# Patient Record
Sex: Male | Born: 1970
Health system: Southern US, Community
[De-identification: ages and names within clinical notes are randomized; demographics above are authoritative.]

## PROBLEM LIST (undated history)

## (undated) DIAGNOSIS — E669 Obesity, unspecified: Secondary | ICD-10-CM

## (undated) DIAGNOSIS — I10 Essential (primary) hypertension: Secondary | ICD-10-CM

## (undated) HISTORY — DX: Obesity, unspecified: E66.9

## (undated) HISTORY — DX: Essential (primary) hypertension: I10

---

## 2006-07-25 ENCOUNTER — Emergency Department (HOSPITAL_COMMUNITY): Admission: EM | Admit: 2006-07-25 | Discharge: 2006-07-25 | Payer: Self-pay | Admitting: Emergency Medicine

## 2007-03-08 ENCOUNTER — Emergency Department (HOSPITAL_COMMUNITY): Admission: EM | Admit: 2007-03-08 | Discharge: 2007-03-08 | Payer: Self-pay | Admitting: Family Medicine

## 2007-03-30 ENCOUNTER — Emergency Department (HOSPITAL_COMMUNITY): Admission: EM | Admit: 2007-03-30 | Discharge: 2007-03-30 | Payer: Self-pay | Admitting: Family Medicine

## 2008-02-09 ENCOUNTER — Emergency Department (HOSPITAL_COMMUNITY): Admission: EM | Admit: 2008-02-09 | Discharge: 2008-02-09 | Payer: Self-pay | Admitting: Emergency Medicine

## 2008-11-07 ENCOUNTER — Emergency Department (HOSPITAL_COMMUNITY): Admission: EM | Admit: 2008-11-07 | Discharge: 2008-11-07 | Payer: Self-pay | Admitting: Emergency Medicine

## 2008-11-10 ENCOUNTER — Ambulatory Visit: Payer: Self-pay | Admitting: Family Medicine

## 2008-12-08 ENCOUNTER — Ambulatory Visit: Payer: Self-pay | Admitting: Family Medicine

## 2010-03-01 ENCOUNTER — Ambulatory Visit
Admission: RE | Admit: 2010-03-01 | Discharge: 2010-03-01 | Payer: Self-pay | Source: Home / Self Care | Attending: Family Medicine | Admitting: Family Medicine

## 2010-04-06 ENCOUNTER — Ambulatory Visit (INDEPENDENT_AMBULATORY_CARE_PROVIDER_SITE_OTHER): Payer: 59 | Admitting: Family Medicine

## 2010-04-06 DIAGNOSIS — I10 Essential (primary) hypertension: Secondary | ICD-10-CM

## 2010-04-06 DIAGNOSIS — F528 Other sexual dysfunction not due to a substance or known physiological condition: Secondary | ICD-10-CM

## 2010-04-06 DIAGNOSIS — Z79899 Other long term (current) drug therapy: Secondary | ICD-10-CM

## 2010-04-30 ENCOUNTER — Ambulatory Visit: Payer: 59 | Admitting: Family Medicine

## 2010-05-25 LAB — CBC
HCT: 41.4 % (ref 39.0–52.0)
MCHC: 34 g/dL (ref 30.0–36.0)
MCV: 88.1 fL (ref 78.0–100.0)
Platelets: 193 10*3/uL (ref 150–400)
RDW: 14.5 % (ref 11.5–15.5)
WBC: 5.1 10*3/uL (ref 4.0–10.5)

## 2010-05-25 LAB — DIFFERENTIAL
Basophils Absolute: 0 10*3/uL (ref 0.0–0.1)
Basophils Relative: 1 % (ref 0–1)
Eosinophils Absolute: 0.1 10*3/uL (ref 0.0–0.7)
Eosinophils Relative: 1 % (ref 0–5)
Neutrophils Relative %: 51 % (ref 43–77)

## 2010-05-25 LAB — BASIC METABOLIC PANEL
BUN: 9 mg/dL (ref 6–23)
Chloride: 106 mEq/L (ref 96–112)
Creatinine, Ser: 1.3 mg/dL (ref 0.4–1.5)
Glucose, Bld: 108 mg/dL — ABNORMAL HIGH (ref 70–99)
Potassium: 3.8 mEq/L (ref 3.5–5.1)

## 2010-10-28 ENCOUNTER — Emergency Department (HOSPITAL_COMMUNITY)
Admission: EM | Admit: 2010-10-28 | Discharge: 2010-10-28 | Disposition: A | Discharge status: Home / Self Care | Payer: 59 | Attending: Emergency Medicine | Admitting: Emergency Medicine

## 2010-10-28 ENCOUNTER — Inpatient Hospital Stay (INDEPENDENT_AMBULATORY_CARE_PROVIDER_SITE_OTHER)
Admission: RE | Admit: 2010-10-28 | Discharge: 2010-10-28 | Disposition: A | Discharge status: Home / Self Care | Payer: 59 | Source: Ambulatory Visit | Attending: Family Medicine | Admitting: Family Medicine

## 2010-10-28 DIAGNOSIS — K420 Umbilical hernia with obstruction, without gangrene: Secondary | ICD-10-CM

## 2010-11-07 ENCOUNTER — Encounter: Payer: Self-pay | Admitting: Family Medicine

## 2011-04-02 ENCOUNTER — Encounter: Payer: Self-pay | Admitting: Family Medicine

## 2011-04-02 ENCOUNTER — Ambulatory Visit (INDEPENDENT_AMBULATORY_CARE_PROVIDER_SITE_OTHER): Payer: 59 | Admitting: Family Medicine

## 2011-04-02 VITALS — BP 150/100 | HR 73 | Ht 70.0 in | Wt 334.0 lb

## 2011-04-02 DIAGNOSIS — Z79899 Other long term (current) drug therapy: Secondary | ICD-10-CM

## 2011-04-02 DIAGNOSIS — I10 Essential (primary) hypertension: Secondary | ICD-10-CM

## 2011-04-02 LAB — CBC WITH DIFFERENTIAL/PLATELET
Basophils Absolute: 0 10*3/uL (ref 0.0–0.1)
Eosinophils Relative: 1 % (ref 0–5)
Lymphocytes Relative: 40 % (ref 12–46)
Lymphs Abs: 2 10*3/uL (ref 0.7–4.0)
MCV: 86.4 fL (ref 78.0–100.0)
Neutro Abs: 2.5 10*3/uL (ref 1.7–7.7)
Neutrophils Relative %: 51 % (ref 43–77)
Platelets: 230 10*3/uL (ref 150–400)
RBC: 4.78 MIL/uL (ref 4.22–5.81)
RDW: 13.9 % (ref 11.5–15.5)
WBC: 5 10*3/uL (ref 4.0–10.5)

## 2011-04-02 MED ORDER — LISINOPRIL-HYDROCHLOROTHIAZIDE 20-12.5 MG PO TABS: 1.0000 | ORAL_TABLET | Freq: Every day | ORAL | Status: DC

## 2011-04-02 MED ORDER — AMLODIPINE BESYLATE 5 MG PO TABS: 5.0000 mg | ORAL_TABLET | Freq: Every day | ORAL | Status: DC

## 2011-04-02 NOTE — Progress Notes (Signed)
Subjective:    Patient ID: Parker Clay, male    DOB: [birth year], 41 y.o.   MRN: [id number]  HPI He is here for an interval evaluation. His last visit was in February of 2012. He would like his medications renewed. He recently had a health evaluation and his blood pressure was elevated. He has no concerns or complaints.   Review of Systems     Objective:   Physical Exam alert and in no distress. Tympanic membranes and canals are normal. Throat is clear. Tonsils are normal. Neck is supple without adenopathy or thyromegaly. Cardiac exam shows a regular sinus rhythm without murmurs or gallops. Lungs are clear to auscultation.        Assessment & Plan:   1. Hypertension  amLODipine (NORVASC) 5 MG tablet, lisinopril-hydrochlorothiazide (PRINZIDE,ZESTORETIC) 20-12.5 MG per tablet, CBC with Differential, Comprehensive metabolic panel, Lipid panel  2. Obesity, Class III, BMI 40-49.9 (morbid obesity)  CBC with Differential, Comprehensive metabolic panel, Lipid panel  3. Encounter for long-term (current) use of other medications  CBC with Differential, Comprehensive metabolic panel, Lipid panel   I will add Norvasc to his regimen. Also discussed in detail his weight in regard to diet and exercise. Strongly encouraged him to increase his physical activities as well as change his eating habits. Recheck here in one month.

## 2011-04-02 NOTE — Patient Instructions (Signed)
Take both of the blood pressure medications and I will see you in one month. Work on making lifestyle changes especially in regard to your exercise and diet.

## 2011-04-03 ENCOUNTER — Other Ambulatory Visit: Payer: Self-pay

## 2011-04-03 LAB — COMPREHENSIVE METABOLIC PANEL
ALT: 25 U/L (ref 0–53)
AST: 30 U/L (ref 0–37)
Calcium: 9.8 mg/dL (ref 8.4–10.5)
Chloride: 107 mEq/L (ref 96–112)
Creat: 1.32 mg/dL (ref 0.50–1.35)
Sodium: 140 mEq/L (ref 135–145)
Total Protein: 7 g/dL (ref 6.0–8.3)

## 2011-04-03 LAB — LIPID PANEL
Total CHOL/HDL Ratio: 4.6 Ratio
VLDL: 25 mg/dL (ref 0–40)

## 2011-04-03 NOTE — Progress Notes (Signed)
Quick Note:  The blood work is normal ______ 

## 2011-04-23 ENCOUNTER — Ambulatory Visit (INDEPENDENT_AMBULATORY_CARE_PROVIDER_SITE_OTHER): Payer: 59 | Admitting: Family Medicine

## 2011-04-23 ENCOUNTER — Encounter: Payer: Self-pay | Admitting: Family Medicine

## 2011-04-23 VITALS — BP 144/96 | HR 86 | Wt 334.0 lb

## 2011-04-23 DIAGNOSIS — I10 Essential (primary) hypertension: Secondary | ICD-10-CM

## 2011-04-23 DIAGNOSIS — E669 Obesity, unspecified: Secondary | ICD-10-CM

## 2011-04-23 MED ORDER — AMLODIPINE BESYLATE 10 MG PO TABS: 10.0000 mg | ORAL_TABLET | Freq: Every day | ORAL | Status: DC

## 2011-04-23 NOTE — Progress Notes (Signed)
Subjective:    Patient ID: Parker Clay, male    DOB: 1970-10-09, 41 y.o.   MRN: [id number]  HPI He is here for recheck. He is continued on lisinopril and amlodipine and having no difficulty. He has made some changes in his diet but not as far as exercise. His wife is pregnant and due April 2.  Review of Systems     Objective:   Physical Exam Alert and in no distress. Blood pressure is recorded       Assessment & Plan:   1. Hypertension   2. Obesity (BMI 30-39.9)    I will increase his Norvasc he noticed pressure is in the good range. Stay on these medications. Continue to work on [employer] and exercise. Recheck here in 6 months.

## 2011-04-23 NOTE — Patient Instructions (Signed)
Stay on these medications. Continue to work on [employer] and exercise.

## 2011-05-02 ENCOUNTER — Emergency Department (HOSPITAL_COMMUNITY)
Admission: EM | Admit: 2011-05-02 | Discharge: 2011-05-02 | Disposition: A | Discharge status: Home / Self Care | Payer: 59 | Attending: Emergency Medicine | Admitting: Emergency Medicine

## 2011-05-02 ENCOUNTER — Encounter (HOSPITAL_COMMUNITY): Payer: Self-pay | Admitting: *Deleted

## 2011-05-02 DIAGNOSIS — I10 Essential (primary) hypertension: Secondary | ICD-10-CM | POA: Insufficient documentation

## 2011-05-02 DIAGNOSIS — F17200 Nicotine dependence, unspecified, uncomplicated: Secondary | ICD-10-CM | POA: Insufficient documentation

## 2011-05-02 DIAGNOSIS — E669 Obesity, unspecified: Secondary | ICD-10-CM | POA: Insufficient documentation

## 2011-05-02 DIAGNOSIS — K429 Umbilical hernia without obstruction or gangrene: Secondary | ICD-10-CM

## 2011-05-02 MED ORDER — ONDANSETRON HCL 4 MG/2ML IJ SOLN
4.0000 mg | Freq: Once | INTRAMUSCULAR | Status: DC
  Filled 2011-05-02: qty 2

## 2011-05-02 MED ORDER — HYDROMORPHONE HCL PF 1 MG/ML IJ SOLN
1.0000 mg | Freq: Once | INTRAMUSCULAR | Status: DC
  Filled 2011-05-02: qty 1

## 2011-05-02 MED ORDER — SODIUM CHLORIDE 0.9 % IV SOLN: 1000.0000 mL | Freq: Once | INTRAVENOUS | Status: DC

## 2011-05-02 MED ORDER — SODIUM CHLORIDE 0.9 % IV SOLN: 1000.0000 mL | INTRAVENOUS | Status: DC

## 2011-05-02 NOTE — ED Notes (Signed)
Pt reports after working out this am, he sneezed and abd hernia began hurting. Sts has tried to reduce, unable to.

## 2011-05-02 NOTE — ED Provider Notes (Signed)
History     CSN: [id number]  Arrival date & time 05/02/11  8295   First MD Initiated Contact with Patient 05/02/11 [phone]      Chief Complaint  Patient presents with  . Hernia    HPI The patient has history of a periumbilical hernia. He was diagnosed approximately a year ago and was instructed to followup for a surgical procedure. Patient states it really had not been giving him any trouble so he did not have this done. This morning he was exercising on a exercise bike when he sneezed and had sudden onset of sharp severe pain in his periumbilical hernia. Patient felt the hernia was bulging out and he was not able to reduce it. Normally he is able to reduce the hernia easily. Patient denies any fevers, vomiting, or diarrhea. Palpation and movement increases pain and discomfort. It is a sharp pain that is 10 out of 10 when it is most severe. Past Medical History  Diagnosis Date  . Obesity   . Hypertension     History reviewed. No pertinent past surgical history.  No family history on file.  History  Substance Use Topics  . Smoking status: Current Some Day Smoker  . Smokeless tobacco: Never Used  . Alcohol Use: Yes     occasionally      Review of Systems  All other systems reviewed and are negative.    Allergies  Review of patient's allergies indicates no known allergies.  Home Medications   Current Outpatient Rx  Name Route Sig Dispense Refill  . AMLODIPINE BESYLATE 10 MG PO TABS Oral Take 1 tablet (10 mg total) by mouth daily. 30 tablet 11  . LISINOPRIL-HYDROCHLOROTHIAZIDE 20-12.5 MG PO TABS Oral Take 1 tablet by mouth daily. 30 tablet 1  . OLMESARTAN MEDOXOMIL-HCTZ 40-12.5 MG PO TABS Oral Take 1 tablet by mouth daily.        BP 145/79  Pulse 71  Temp(Src) 97.8 F (36.6 C) (Oral)  Resp 16  SpO2 100%  Physical Exam  Nursing note and vitals reviewed. Constitutional: He appears well-developed and well-nourished. No distress.  HENT:  Head: Normocephalic and  atraumatic.  Right Ear: External ear normal.  Left Ear: External ear normal.  Eyes: Conjunctivae are normal. Right eye exhibits no discharge. Left eye exhibits no discharge. No scleral icterus.  Neck: Neck supple. No tracheal deviation present.  Cardiovascular: Normal rate, regular rhythm and intact distal pulses.   Pulmonary/Chest: Effort normal and breath sounds normal. No stridor. No respiratory distress. He has no wheezes. He has no rales.  Abdominal: Soft. Bowel sounds are normal. He exhibits no distension. There is no tenderness. There is no rebound and no guarding. A hernia is present.         Periumbilical hernia, firm on the right side, unable to reduce, approximately 2 cm in size  Musculoskeletal: He exhibits no edema and no tenderness.  Neurological: He is alert. He has normal strength. No sensory deficit. Cranial nerve deficit:  no gross defecits noted. He exhibits normal muscle tone. He displays no seizure activity. Coordination normal.  Skin: Skin is warm and dry. No rash noted.  Psychiatric: He has a normal mood and affect.    ED Course  Procedures (including critical care time)   Labs Reviewed  CBC  DIFFERENTIAL  LIPASE, BLOOD  BASIC METABOLIC PANEL   No results found.    MDM  I was initially unable to reduce the patient's hernia because of his pain and discomfort. An  IV was ordered to administer pain medications. The nurse was attempting to do that and was unsuccessful on her first attempt. She went back to get additional equipment and when she went back into the room the patient told her that the hernia had reduced. I went back in and reexamined the patient. The hernia has completely reduced. The periumbilical region is soft without any tenderness. The hernia defect is palpable but there is no sign of incarceration or strangulation at this time.  I will discharge the patient with a referral to [facility] surgery        Celene Kras, MD 05/02/11 [phone]

## 2011-05-02 NOTE — Discharge Instructions (Signed)
Hernia  A hernia occurs when an internal organ pushes out through a weak spot in the abdominal wall. Hernias most commonly occur in the groin and around the navel. Hernias often can be pushed back into place (reduced). Most hernias tend to get worse over time. Some abdominal hernias can get stuck in the opening (irreducible or incarcerated hernia) and cannot be reduced. An irreducible abdominal hernia which is tightly squeezed into the opening is at risk for impaired blood supply (strangulated hernia). A strangulated hernia is a medical emergency. Because of the risk for an irreducible or strangulated hernia, surgery may be recommended to repair a hernia.  CAUSES    Heavy lifting.   Prolonged coughing.   Straining to have a bowel movement.   A cut (incision) made during an abdominal surgery.  HOME CARE INSTRUCTIONS    Bed rest is not required. You may continue your normal activities.   Avoid lifting more than 10 pounds (4.5 kg) or straining.   Cough gently. If you are a smoker it is best to stop. Even the best hernia repair can break down with the continual strain of coughing. Even if you do not have your hernia repaired, a cough will continue to aggravate the problem.   Do not wear anything tight over your hernia. Do not try to keep it in with an outside bandage or truss. These can damage abdominal contents if they are trapped within the hernia sac.   Eat a normal diet.   Avoid constipation. Straining over long periods of time will increase hernia size and encourage breakdown of repairs. If you cannot do this with diet alone, stool softeners may be used.  SEEK IMMEDIATE MEDICAL CARE IF:    You have a fever.   You develop increasing abdominal pain.   You feel nauseous or vomit.   Your hernia is stuck outside the abdomen, looks discolored, feels hard, or is tender.   You have any changes in your bowel habits or in the hernia that are unusual for you.   You have increased pain or swelling around the  hernia.   You cannot push the hernia back in place by applying gentle pressure while lying down.  MAKE SURE YOU:    Understand these instructions.   Will watch your condition.   Will get help right away if you are not doing well or get worse.  Document Released: 02/04/2005 Document Revised: 01/24/2011 Document Reviewed: 09/24/2007  [employer] Patient Information 2012 [employer].

## 2011-05-02 NOTE — ED Notes (Signed)
RN in to attempt IV start, left room for additional supplies. Upon return, pt sts hernia reduced by itself. Denies pain at this time. Lynelle Doctor, EDP informed and upon reassessment, pt no longer needs intervention. Discharge pended.

## 2011-05-09 ENCOUNTER — Telehealth: Payer: Self-pay | Admitting: Family Medicine

## 2011-05-10 ENCOUNTER — Ambulatory Visit: Payer: 59 | Admitting: Family Medicine

## 2011-05-10 ENCOUNTER — Other Ambulatory Visit: Payer: 59

## 2011-05-10 NOTE — Telephone Encounter (Signed)
APPT MONDAY

## 2011-05-13 ENCOUNTER — Encounter: Payer: Self-pay | Admitting: Family Medicine

## 2011-05-13 ENCOUNTER — Ambulatory Visit (INDEPENDENT_AMBULATORY_CARE_PROVIDER_SITE_OTHER): Payer: 59 | Admitting: Family Medicine

## 2011-05-13 VITALS — BP 130/80 | HR 88 | Wt 333.0 lb

## 2011-05-13 DIAGNOSIS — I10 Essential (primary) hypertension: Secondary | ICD-10-CM

## 2011-05-13 DIAGNOSIS — H209 Unspecified iridocyclitis: Secondary | ICD-10-CM

## 2011-05-13 DIAGNOSIS — K429 Umbilical hernia without obstruction or gangrene: Secondary | ICD-10-CM

## 2011-05-13 LAB — CBC WITH DIFFERENTIAL/PLATELET
Hemoglobin: 13.8 g/dL (ref 13.0–17.0)
Lymphocytes Relative: 40 % (ref 12–46)
Lymphs Abs: 1.9 10*3/uL (ref 0.7–4.0)
MCV: 87.6 fL (ref 78.0–100.0)
Neutrophils Relative %: 51 % (ref 43–77)
Platelets: 241 10*3/uL (ref 150–400)
RBC: 4.9 MIL/uL (ref 4.22–5.81)
WBC: 4.9 10*3/uL (ref 4.0–10.5)

## 2011-05-13 LAB — COMPREHENSIVE METABOLIC PANEL
ALT: 23 U/L (ref 0–53)
Albumin: 4.3 g/dL (ref 3.5–5.2)
CO2: 30 mEq/L (ref 19–32)
Calcium: 10 mg/dL (ref 8.4–10.5)
Chloride: 105 mEq/L (ref 96–112)
Potassium: 4.2 mEq/L (ref 3.5–5.3)
Sodium: 139 mEq/L (ref 135–145)
Total Protein: 7.3 g/dL (ref 6.0–8.3)

## 2011-05-13 LAB — POCT URINALYSIS DIPSTICK
Blood, UA: NEGATIVE
Glucose, UA: NEGATIVE
Protein, UA: NEGATIVE
Spec Grav, UA: 1.02
Urobilinogen, UA: NEGATIVE

## 2011-05-13 NOTE — Patient Instructions (Signed)
I will call you with the blood results and chest x-ray results.

## 2011-05-13 NOTE — Progress Notes (Signed)
Subjective:    Patient ID: Parker Clay, male    DOB: [birth year], 41 y.o.   MRN: [id number]  HPI He is here for a consultation concerning a recent diagnosis of uveitis. This is apparently the third time this has occurred. He was sent here by his optometrist. Presently he is on drops for this. He has had no fever, chills, sore throat, congestion, shortness of breath, weight change, joint issues. He does get reflux symptoms weekly and takes OTC meds for this. He has also been seen recently for abdominal pain and diagnosed with umbilical hernia. He plans to call a general surgeon for this sometime in the near future. He continues on medications listed in the chart for his hypertension.   Review of Systems Negative except as above    Objective:   Physical Exam alert and in no distress. Pupils are dilated Tympanic membranes and canals are normal. Throat is clear. Tonsils are normal. Neck is supple without adenopathy or thyromegaly. Cardiac exam shows a regular sinus rhythm without murmurs or gallops. Lungs are clear to auscultation. No joint swelling noted. 2 small periumbilical hernias are noted.        Assessment & Plan:   1. Uveitis  CBC with Differential, Comprehensive metabolic panel, Sedimentation Rate, Angiotensin converting enzyme, Urinalysis Dipstick, DG Chest 2 View, RPR, ANA, Rheumatoid factor  2. Hypertension    3. Umbilical hernia     his case was discussed with Dr. Kellie Simmering.

## 2011-05-14 LAB — SEDIMENTATION RATE: Sed Rate: 17 mm/hr — ABNORMAL HIGH (ref 0–16)

## 2011-05-14 LAB — ANA: Anti Nuclear Antibody(ANA): NEGATIVE

## 2011-06-12 ENCOUNTER — Other Ambulatory Visit: Payer: Self-pay | Admitting: Family Medicine

## 2011-08-22 ENCOUNTER — Encounter (HOSPITAL_COMMUNITY): Payer: Self-pay | Admitting: Emergency Medicine

## 2011-08-22 ENCOUNTER — Emergency Department (HOSPITAL_COMMUNITY)
Admission: EM | Admit: 2011-08-22 | Discharge: 2011-08-22 | Disposition: A | Discharge status: Home / Self Care | Payer: 59 | Attending: Emergency Medicine | Admitting: Emergency Medicine

## 2011-08-22 DIAGNOSIS — F17200 Nicotine dependence, unspecified, uncomplicated: Secondary | ICD-10-CM | POA: Insufficient documentation

## 2011-08-22 DIAGNOSIS — B353 Tinea pedis: Secondary | ICD-10-CM | POA: Insufficient documentation

## 2011-08-22 MED ORDER — IBUPROFEN 800 MG PO TABS: 800.0000 mg | ORAL_TABLET | Freq: Three times a day (TID) | ORAL | Status: AC

## 2011-08-22 MED ORDER — CLOTRIMAZOLE-BETAMETHASONE 1-0.05 % EX CREA: TOPICAL_CREAM | CUTANEOUS | Status: DC

## 2011-08-22 NOTE — ED Provider Notes (Signed)
History     CSN: [id number]  Arrival date & time 08/22/11  1703   First MD Initiated Contact with Patient 08/22/11 1706      No chief complaint on file.   (Consider location/radiation/quality/duration/timing/severity/associated sxs/prior treatment) HPI Patient presents emergency Dept. with pain in between his fourth and fifth toes on the right foot.  He states there is some material in between the toes and the skin is raw.  Patient, states that he has not injured his foot in any way.  Patient, states that he has not tried a treatment prior to arrival for these issues between his toes.  Past Medical History  Diagnosis Date  . Obesity   . Hypertension     No past surgical history on file.  No family history on file.  History  Substance Use Topics  . Smoking status: Current Some Day Smoker  . Smokeless tobacco: Never Used  . Alcohol Use: Yes     occasionally      Review of Systems All other systems negative except as documented in the HPI. All pertinent positives and negatives as reviewed in the HPI.  Allergies  Review of patient's allergies indicates no known allergies.  Home Medications   Current Outpatient Rx  Name Route Sig Dispense Refill  . ACETAMINOPHEN 500 MG PO TABS Oral Take 500 mg by mouth every 6 (six) hours as needed. For pain    . AMLODIPINE BESYLATE 10 MG PO TABS Oral Take 1 tablet (10 mg total) by mouth daily. 30 tablet 11  . LISINOPRIL-HYDROCHLOROTHIAZIDE 20-12.5 MG PO TABS  TAKE 1 TABLET BY MOUTH EVERY DAY 30 tablet 5    BP 144/91  Pulse 96  Temp 98.4 F (36.9 C) (Oral)  Resp 16  SpO2 97%  Physical Exam  Nursing note and vitals reviewed. Constitutional: He appears well-developed and well-nourished. No distress.  Musculoskeletal:       Feet:    ED Course  Procedures (including critical care time)  Patient has a tinea infection between his toes. Told to keep area as dry as possible. Told to change his socks multiple times per day. He is  advised to put gauze in between his toes. The patient will be asked to return here as needed.   MDM          Carlyle Dolly, PA-C 08/22/11 1720

## 2011-08-22 NOTE — ED Notes (Signed)
Pt. With broken skin under 5th toe for several months.  Pt. Reports he had been treating his foot with anti-fungal creams with no improvement.

## 2011-08-22 NOTE — ED Provider Notes (Signed)
Medical screening examination/treatment/procedure(s) were conducted as a shared visit with non-physician practitioner(s) and myself.  I personally evaluated the patient during the encounter Patient with athlete's foot and a macerated area on his toe. There is no sign of abscess or other underlying pathology. Patient given instructions on how to treat this.  Gwyneth Sprout, MD 08/22/11 [phone]

## 2011-10-25 ENCOUNTER — Ambulatory Visit: Payer: 59 | Admitting: Family Medicine

## 2012-02-17 ENCOUNTER — Encounter (HOSPITAL_COMMUNITY): Payer: Self-pay | Admitting: *Deleted

## 2012-02-17 ENCOUNTER — Emergency Department (HOSPITAL_COMMUNITY)
Admission: EM | Admit: 2012-02-17 | Discharge: 2012-02-17 | Disposition: A | Discharge status: Home / Self Care | Payer: 59 | Attending: Emergency Medicine | Admitting: Emergency Medicine

## 2012-02-17 DIAGNOSIS — S199XXA Unspecified injury of neck, initial encounter: Secondary | ICD-10-CM | POA: Insufficient documentation

## 2012-02-17 DIAGNOSIS — Y939 Activity, unspecified: Secondary | ICD-10-CM | POA: Insufficient documentation

## 2012-02-17 DIAGNOSIS — R220 Localized swelling, mass and lump, head: Secondary | ICD-10-CM | POA: Insufficient documentation

## 2012-02-17 DIAGNOSIS — Z79899 Other long term (current) drug therapy: Secondary | ICD-10-CM | POA: Insufficient documentation

## 2012-02-17 DIAGNOSIS — R221 Localized swelling, mass and lump, neck: Secondary | ICD-10-CM | POA: Insufficient documentation

## 2012-02-17 DIAGNOSIS — S0993XA Unspecified injury of face, initial encounter: Secondary | ICD-10-CM | POA: Insufficient documentation

## 2012-02-17 DIAGNOSIS — Y929 Unspecified place or not applicable: Secondary | ICD-10-CM | POA: Insufficient documentation

## 2012-02-17 DIAGNOSIS — I10 Essential (primary) hypertension: Secondary | ICD-10-CM | POA: Insufficient documentation

## 2012-02-17 DIAGNOSIS — X58XXXA Exposure to other specified factors, initial encounter: Secondary | ICD-10-CM | POA: Insufficient documentation

## 2012-02-17 DIAGNOSIS — E669 Obesity, unspecified: Secondary | ICD-10-CM | POA: Insufficient documentation

## 2012-02-17 DIAGNOSIS — F17200 Nicotine dependence, unspecified, uncomplicated: Secondary | ICD-10-CM | POA: Insufficient documentation

## 2012-02-17 MED ORDER — PENICILLIN V POTASSIUM 500 MG PO TABS: ORAL_TABLET | ORAL | Status: DC

## 2012-02-17 MED ORDER — IBUPROFEN 400 MG PO TABS: 800.0000 mg | ORAL_TABLET | Freq: Four times a day (QID) | ORAL | Status: DC | PRN

## 2012-02-17 MED ORDER — IBUPROFEN 800 MG PO TABS
800.0000 mg | ORAL_TABLET | Freq: Once | ORAL | Status: AC
Start: 2012-02-17 — End: 2012-02-17
  Administered 2012-02-17: 800 mg via ORAL
  Filled 2012-02-17: qty 1

## 2012-02-17 NOTE — ED Provider Notes (Signed)
History     CSN: [id number]  Arrival date & time 02/17/12  0824   None     Chief Complaint: left cheek pain x 4 days, swelling   (Consider location/radiation/quality/duration/timing/severity/associated sxs/prior treatment) Patient is a 41 y.o. male presenting with dental injury. The history is provided by the patient.  Dental Injury Chronicity: Pt bit into cheek 4 days ago. Experienced mild pain and swelling that has been increasing. Pertinent negatives include no abdominal pain, change in bowel habit, chest pain, chills, congestion, coughing, diaphoresis, fever, headaches, joint swelling, neck pain, numbness, rash, sore throat, vomiting or weakness. Associated symptoms comments: No airway difficulties. Nothing aggravates the symptoms. He has tried NSAIDs for the symptoms. Improvement on treatment: Ibuprofen worked for 3 days, but did not work today.    Past Medical History  Diagnosis Date  . Obesity   . Hypertension     History reviewed. No pertinent past surgical history.  History reviewed. No pertinent family history.  History  Substance Use Topics  . Smoking status: Current Some Day Smoker  . Smokeless tobacco: Never Used  . Alcohol Use: Yes     Comment: occasionally      Review of Systems  Constitutional: Negative for fever, chills, diaphoresis and appetite change.  HENT: Positive for facial swelling. Negative for ear pain, congestion, sore throat, neck pain, neck stiffness, tinnitus and ear discharge.   Eyes: Negative for visual disturbance.  Respiratory: Negative for apnea, cough, shortness of breath, wheezing and stridor.   Cardiovascular: Negative for chest pain.  Gastrointestinal: Negative for vomiting, abdominal pain and change in bowel habit.  Musculoskeletal: Negative for joint swelling.  Skin: Negative for rash.  Neurological: Negative for dizziness, syncope, speech difficulty, weakness, light-headedness, numbness and headaches.  All other systems  reviewed and are negative.    Allergies  Review of patient's allergies indicates no known allergies.  Home Medications   Current Outpatient Rx  Name  Route  Sig  Dispense  Refill  . ACETAMINOPHEN 500 MG PO TABS   Oral   Take 500 mg by mouth every 6 (six) hours as needed. For pain         . AMLODIPINE BESYLATE 10 MG PO TABS   Oral   Take 1 tablet (10 mg total) by mouth daily.   30 tablet   11   . CLOTRIMAZOLE-BETAMETHASONE 1-0.05 % EX CREA      Apply to affected area 2 times daily prn   45 g   0   . IBUPROFEN 200 MG PO TABS   Oral   Take 200 mg by mouth every 6 (six) hours as needed. pain         . LISINOPRIL-HYDROCHLOROTHIAZIDE 20-12.5 MG PO TABS      TAKE 1 TABLET BY MOUTH EVERY DAY   30 tablet   5     BP 154/67  Pulse 81  Temp 98.4 F (36.9 C) (Oral)  Resp 16  SpO2 95%  Physical Exam  Constitutional: He is oriented to person, place, and time. He appears well-developed and well-nourished. No distress.  HENT:  Head: Normocephalic and atraumatic.  Right Ear: External ear normal.  Left Ear: External ear normal.  Nose: Nose normal.  Mouth/Throat: Uvula is midline, oropharynx is clear and moist and mucous membranes are normal. Normal dentition. No dental abscesses, lacerations or dental caries. No oropharyngeal exudate, posterior oropharyngeal edema, posterior oropharyngeal erythema or tonsillar abscesses.         Swelling noted on left side  of face. Mild pain on palpation at left cheek radiating postauricularly.  Eyes: Conjunctivae normal and EOM are normal. Pupils are equal, round, and reactive to light. Right eye exhibits no discharge. Left eye exhibits no discharge. No scleral icterus.  Neck: Normal range of motion. Neck supple.  Cardiovascular: Normal rate, regular rhythm and normal heart sounds.  Exam reveals no gallop and no friction rub.   No murmur heard. Pulmonary/Chest: Effort normal and breath sounds normal. No respiratory distress. He has no  wheezes. He has no rales. He exhibits no tenderness.  Abdominal: Soft. Bowel sounds are normal. He exhibits no distension. There is no tenderness.  Musculoskeletal: Normal range of motion.  Lymphadenopathy:    He has no cervical adenopathy.  Neurological: He is alert and oriented to person, place, and time.  Skin: Skin is warm and dry. No rash noted. He is not diaphoretic.    ED Course  Procedures (including critical care time)  Labs Reviewed - No data to display No results found.   Diagnosis: cheek swelling    MDM  41 y.o. Male complaining of pain and swelling to left cheek after biting it while eating 4 days ago. Pt is known to grind his teeth while sleeping which may be exacerbating the injury. No pus, no bleeding noted on exam. Membranes normal. Prophylactically prescribed penicillin for unseen dental abscess and ibuprofen for pain. Discussed benefits of sleeping with a dental guard and supportive care. Advised to follow up with dentist to prevent further exacerbation.  Glade Nurse, PA-C 02/17/12 1203

## 2012-02-17 NOTE — ED Notes (Signed)
Pt states Thursday was eating chicken, bit down too hard and bit lip and inside of L cheek, then Friday started having swelling of L cheek which has gotten worse. Pt has been taking ibuprofen. Denies dental pain.

## 2012-02-18 NOTE — ED Provider Notes (Signed)
Medical screening examination/treatment/procedure(s) were performed by non-physician practitioner and as supervising physician I was immediately available for consultation/collaboration.  Geoffery Lyons, MD 02/18/12 [phone]

## 2012-02-20 ENCOUNTER — Ambulatory Visit: Payer: 59 | Admitting: Family Medicine

## 2012-03-11 ENCOUNTER — Other Ambulatory Visit: Payer: Self-pay | Admitting: Family Medicine

## 2012-05-06 ENCOUNTER — Other Ambulatory Visit: Payer: Self-pay

## 2012-05-06 MED ORDER — AMLODIPINE BESYLATE 5 MG PO TABS: ORAL_TABLET | ORAL | Status: DC

## 2012-05-06 NOTE — Telephone Encounter (Signed)
SENT PT MED IN

## 2012-06-10 ENCOUNTER — Telehealth: Payer: Self-pay | Admitting: Internal Medicine

## 2012-06-10 MED ORDER — AMLODIPINE BESYLATE 10 MG PO TABS: 10.0000 mg | ORAL_TABLET | Freq: Every day | ORAL | Status: DC

## 2012-06-10 MED ORDER — LISINOPRIL-HYDROCHLOROTHIAZIDE 20-12.5 MG PO TABS: 1.0000 | ORAL_TABLET | Freq: Every day | ORAL | Status: DC

## 2012-06-10 NOTE — Telephone Encounter (Signed)
Pt has a physical scheduled for may 30 at 10am , i have given him a 30 day supply of his meds

## 2012-06-19 ENCOUNTER — Ambulatory Visit (INDEPENDENT_AMBULATORY_CARE_PROVIDER_SITE_OTHER): Payer: 59 | Admitting: Family Medicine

## 2012-06-19 VITALS — BP 142/92 | HR 86 | Wt 330.0 lb

## 2012-06-19 DIAGNOSIS — S76312A Strain of muscle, fascia and tendon of the posterior muscle group at thigh level, left thigh, initial encounter: Secondary | ICD-10-CM

## 2012-06-19 DIAGNOSIS — IMO0002 Reserved for concepts with insufficient information to code with codable children: Secondary | ICD-10-CM

## 2012-06-19 NOTE — Progress Notes (Signed)
Subjective:    Patient ID: Parker Clay, male    DOB: [birth year], 42 y.o.   MRN: [id number]  HPI He injured his left hamstrings earlier today moving a dryer. He felt a popping sensation in the hamstrings area.   Review of Systems     Objective:   Physical Exam Exam of the left posterior thigh shows no palpable tenderness or lesion. Flexion of the biceps noted no change. No ecchymosis noted       Assessment & Plan:  Left hamstring muscle strain, initial encounter recommend conservative care with rest, ice, compression. Also recommend 800 mg ibuprofen 3 times per day as needed. Cautioned that he might see bleeding that can progressed down into his ankle

## 2012-06-19 NOTE — Patient Instructions (Signed)
Use a six-inch Ace wrap and start distal and work up toward her hips and you'll probably need and crit on your waist. Ice for 20 minutes 3 times per day. You can take 800 mg of ibuprofen 3 times a day. Listen to your body; it hurts don't do it

## 2012-06-24 ENCOUNTER — Telehealth: Payer: Self-pay | Admitting: Internal Medicine

## 2012-06-24 MED ORDER — LISINOPRIL-HYDROCHLOROTHIAZIDE 20-12.5 MG PO TABS: 1.0000 | ORAL_TABLET | Freq: Every day | ORAL | Status: DC

## 2012-06-24 NOTE — Telephone Encounter (Signed)
MED SENT IN 

## 2012-06-24 NOTE — Telephone Encounter (Signed)
Refill request for lisinopril-hctz 20-12.5mg  #30 to [facility] on [address]

## 2012-07-17 ENCOUNTER — Encounter: Payer: Self-pay | Admitting: Family Medicine

## 2012-08-27 ENCOUNTER — Encounter (HOSPITAL_COMMUNITY): Payer: Self-pay | Admitting: Emergency Medicine

## 2012-08-27 ENCOUNTER — Emergency Department (INDEPENDENT_AMBULATORY_CARE_PROVIDER_SITE_OTHER)
Admission: EM | Admit: 2012-08-27 | Discharge: 2012-08-27 | Disposition: A | Discharge status: Home / Self Care | Payer: 59 | Source: Home / Self Care | Attending: Family Medicine | Admitting: Family Medicine

## 2012-08-27 DIAGNOSIS — H209 Unspecified iridocyclitis: Secondary | ICD-10-CM

## 2012-08-27 DIAGNOSIS — H5710 Ocular pain, unspecified eye: Secondary | ICD-10-CM

## 2012-08-27 MED ORDER — SCOPOLAMINE HBR 0.25 % OP SOLN: 1.0000 [drp] | Freq: Four times a day (QID) | OPHTHALMIC | Status: DC | PRN

## 2012-08-27 MED ORDER — NEOMYCIN-POLYMYXIN-HC 3.5-10000-1 OP SUSP: 2.0000 [drp] | Freq: Four times a day (QID) | OPHTHALMIC | Status: DC

## 2012-08-27 NOTE — ED Provider Notes (Signed)
   History    CSN: [id number] Arrival date & time 08/27/12  4098  First MD Initiated Contact with Patient 08/27/12 1949     Chief Complaint  Patient presents with  . Eye Pain    left eye pain with redness. denies drainage, fever, n/v/d    HPI Eye pain. Started on Tuesday. Sharp and ache in nature. L eye. Denies fevers, nausea, vomiting. Painful. Decreased visual acuity. Similar experience yearly for the past 3 years. Previous optho evaluations have all been unremarkable for glaucoma. Denies any eye trauma. Sinus infection about 10 days ago.   Past Medical History  Diagnosis Date  . Obesity   . Hypertension    History reviewed. No pertinent past surgical history. History reviewed. No pertinent family history. History  Substance Use Topics  . Smoking status: Current Some Day Smoker  . Smokeless tobacco: Never Used  . Alcohol Use: Yes     Comment: occasionally    Review of Systems Per hPI w/ all other systems negative Allergies  Review of patient's allergies indicates no known allergies.  Home Medications   Current Outpatient Rx  Name  Route  Sig  Dispense  Refill  . amLODipine (NORVASC) 10 MG tablet   Oral   Take 1 tablet (10 mg total) by mouth daily.   30 tablet   0   . lisinopril-hydrochlorothiazide (PRINZIDE,ZESTORETIC) 20-12.5 MG per tablet   Oral   Take 1 tablet by mouth daily.   30 tablet   5   . acetaminophen (TYLENOL) 500 MG tablet   Oral   Take 500 mg by mouth every 6 (six) hours as needed. For pain          BP 152/99  Pulse 79  Temp(Src) 98.2 F (36.8 C) (Oral)  Resp 18  SpO2 100% Physical Exam Gen: NAD HEENT: EOMI, PERRL, conjunctiva injected. No purulent discharge, optic disc intact and nml.   Vision screening w/ corrective glasses L 20/50 R 20/20 B 20/20    ED Course  Procedures (including critical care time) Labs Reviewed - No data to display No results found. No diagnosis found.  MDM  41yo AAM w/ likely iritis/uveitis but  concerned for possible bacterial infectious conjunctivitis as eye is painful adn unilateral.  - Scopolamine drops - Cortispoein drops - Pt to call optho in am for appt.   Shelly Flatten, MD Family Medicine PGY-3 08/27/2012, 8:43 PM    Ozella Rocks, MD 08/27/12 [phone]

## 2012-08-27 NOTE — ED Notes (Signed)
Pt c/o redness and pain of left eye since Tuesday. Pt has not tried any otc meds for treatment.  Denies drainage, fever, n/vd. And injury.

## 2012-08-30 NOTE — ED Provider Notes (Signed)
Medical screening examination/treatment/procedure(s) were performed by resident physician or non-physician practitioner and as supervising physician I was immediately available for consultation/collaboration.   Barkley Bruns MD.   Linna Hoff, MD 08/30/12 [phone]

## 2012-10-02 ENCOUNTER — Other Ambulatory Visit: Payer: Self-pay | Admitting: Family Medicine

## 2012-10-30 ENCOUNTER — Other Ambulatory Visit: Payer: Self-pay | Admitting: Family Medicine

## 2012-12-02 ENCOUNTER — Encounter: Payer: Self-pay | Admitting: Family Medicine

## 2012-12-02 ENCOUNTER — Ambulatory Visit (INDEPENDENT_AMBULATORY_CARE_PROVIDER_SITE_OTHER): Payer: 59 | Admitting: Family Medicine

## 2012-12-02 VITALS — BP 142/86 | HR 86 | Ht 69.0 in | Wt 333.0 lb

## 2012-12-02 DIAGNOSIS — K429 Umbilical hernia without obstruction or gangrene: Secondary | ICD-10-CM

## 2012-12-02 DIAGNOSIS — Z23 Encounter for immunization: Secondary | ICD-10-CM

## 2012-12-02 DIAGNOSIS — Z0000 Encounter for general adult medical examination without abnormal findings: Secondary | ICD-10-CM

## 2012-12-02 DIAGNOSIS — N500 Atrophy of testis: Secondary | ICD-10-CM

## 2012-12-02 DIAGNOSIS — I10 Essential (primary) hypertension: Secondary | ICD-10-CM

## 2012-12-02 LAB — COMPREHENSIVE METABOLIC PANEL
AST: 29 U/L (ref 0–37)
Albumin: 4.3 g/dL (ref 3.5–5.2)
Alkaline Phosphatase: 77 U/L (ref 39–117)
Potassium: 4.1 mEq/L (ref 3.5–5.3)
Sodium: 136 mEq/L (ref 135–145)
Total Protein: 7.4 g/dL (ref 6.0–8.3)

## 2012-12-02 LAB — POCT URINALYSIS DIPSTICK
Bilirubin, UA: NEGATIVE
Leukocytes, UA: NEGATIVE
Nitrite, UA: NEGATIVE
Protein, UA: NEGATIVE
Urobilinogen, UA: NEGATIVE
pH, UA: 7

## 2012-12-02 LAB — CBC WITH DIFFERENTIAL/PLATELET
Basophils Absolute: 0 10*3/uL (ref 0.0–0.1)
Basophils Relative: 0 % (ref 0–1)
MCHC: 33.6 g/dL (ref 30.0–36.0)
Neutro Abs: 2.4 10*3/uL (ref 1.7–7.7)
Neutrophils Relative %: 47 % (ref 43–77)
Platelets: 238 10*3/uL (ref 150–400)
RDW: 15.2 % (ref 11.5–15.5)

## 2012-12-02 LAB — LIPID PANEL
HDL: 37 mg/dL — ABNORMAL LOW (ref >39)
LDL Cholesterol: 105 mg/dL — ABNORMAL HIGH (ref 0–99)
VLDL: 23 mg/dL (ref 0–40)

## 2012-12-02 LAB — HEMOCCULT GUIAC POC 1CARD (OFFICE)

## 2012-12-02 NOTE — Progress Notes (Signed)
Subjective:    Patient ID: Parker Clay, male    DOB: 10-May-1970, 42 y.o.   MRN: [id number]  HPI He is here for complete examination. He has no particular concerns or complaints other than umbilical hernia. He continues on his present medication regimen. His work is going well. His home life is quite busy with children of his and hers. Review of his history indicates he had an undescended right testes with surgical procedure.   Review of Systems  Constitutional: Negative.   HENT: Negative.   Eyes: Negative.   Respiratory: Negative.   Cardiovascular: Negative.   Gastrointestinal: Negative.   Endocrine: Negative.   Musculoskeletal: Negative.   Allergic/Immunologic: Negative.   Neurological: Negative.   Hematological: Negative.   Psychiatric/Behavioral: Negative.        Objective:   Physical Exam BP 142/86  Pulse 86  Ht 5\' 9"  (1.753 m)  Wt 333 lb (151.048 kg)  BMI 49.15 kg/m2  General Appearance:    Alert, cooperative, no distress, appears stated age  Head:    Normocephalic, without obvious abnormality, atraumatic  Eyes:    PERRL, conjunctiva/corneas clear, EOM's intact, fundi    benign  Ears:    Normal TM's and external ear canals  Nose:   Nares normal, mucosa normal, no drainage or sinus   tenderness  Throat:   Lips, mucosa, and tongue normal; teeth and gums normal  Neck:   Supple, no lymphadenopathy;  thyroid:  no   enlargement/tenderness/nodules; no carotid   bruit or JVD  Back:    Spine nontender, no curvature, ROM normal, no CVA     tenderness  Lungs:     Clear to auscultation bilaterally without wheezes, rales or     ronchi; respirations unlabored  Chest Wall:    No tenderness or deformity   Heart:    Regular rate and rhythm, S1 and S2 normal, no murmur, rub   or gallop  Breast Exam:    No chest wall tenderness, masses or gynecomastia  Abdomen:     Soft, non-tender, nondistended, normoactive bowel sounds,    Umbilical hernia is noted, no hepatosplenomegaly   Genitalia:    Normal male external genitalia without lesions.  Testicles without masses with atrophy of the right testes.  No inguinal hernias.  Rectal:    Normal sphincter tone, no masses or tenderness; guaiac negative stool.  Prostate smooth, no nodules, not enlarged.  Extremities:   No clubbing, cyanosis or edema  Pulses:   2+ and symmetric all extremities  Skin:   Skin color, texture, turgor normal, no rashes or lesions  Lymph nodes:   Cervical, supraclavicular, and axillary nodes normal  Neurologic:   CNII-XII intact, normal strength, sensation and gait; reflexes 2+ and symmetric throughout          Psych:   Normal mood, affect, hygiene and grooming.          Assessment & Plan:  Routine general medical examination at a health care facility - Plan: CBC with Differential, Comprehensive metabolic panel, Lipid panel, Hemoccult - 1 Card (office)  Hypertension - Plan: POCT Urinalysis Dipstick, CBC with Differential, Comprehensive metabolic panel  Need for prophylactic vaccination and inoculation against influenza - Plan: Flu Vaccine QUAD 36+ mos PF IM (Fluarix)  Immunization due - Plan: Tdap vaccine greater than or equal to 7yo IM  Obesity, Class III, BMI 40-49.9 (morbid obesity) - Plan: Amb ref to Medical Nutrition Therapy-MNT, CBC with Differential, Comprehensive metabolic panel, Lipid panel  Umbilical hernia  Testicular atrophy  discussed the treatment of the hernia and at this time he has no time set aside to have a procedure. Explained that this can be elective. Had a discussion with him concerning lifestyle modification in regard to diet and exercise. Will refer to nutritionist to help with that also discussed increasing his physical activities especially during his breaks while at work. Discussed risk of weight in regard to blood pressure, diabetes etc.

## 2012-12-03 NOTE — Progress Notes (Signed)
Quick Note:  CALLED PT CELL # LEFT MESSAGE THAT DR.LALONDE SAID HIS LABS LOOK GOOD ______

## 2013-06-29 ENCOUNTER — Other Ambulatory Visit: Payer: Self-pay | Admitting: Family Medicine

## 2013-06-29 NOTE — Telephone Encounter (Signed)
Pt called and requested refills on lisinopril HCTZ and Norvasc sent to [facility] [address]

## 2013-08-19 ENCOUNTER — Other Ambulatory Visit: Payer: Self-pay | Admitting: Family Medicine

## 2013-10-13 ENCOUNTER — Other Ambulatory Visit: Payer: Self-pay | Admitting: Family Medicine

## 2014-04-25 ENCOUNTER — Other Ambulatory Visit: Payer: Self-pay | Admitting: Family Medicine

## 2014-04-26 ENCOUNTER — Other Ambulatory Visit: Payer: Self-pay | Admitting: Family Medicine

## 2014-04-26 NOTE — Telephone Encounter (Signed)
Called and left a message stating pt needs to schedule an appt for a physical and once that is done we can send in a 30 day supply to pharmacy

## 2014-04-26 NOTE — Telephone Encounter (Signed)
Pt made CPE appt for 05/03/14

## 2014-05-03 ENCOUNTER — Encounter: Payer: Self-pay | Admitting: Family Medicine

## 2014-05-03 ENCOUNTER — Ambulatory Visit (INDEPENDENT_AMBULATORY_CARE_PROVIDER_SITE_OTHER): Payer: 59 | Admitting: Family Medicine

## 2014-05-03 VITALS — BP 120/76 | HR 78 | Ht 71.0 in | Wt 336.0 lb

## 2014-05-03 DIAGNOSIS — Z0000 Encounter for general adult medical examination without abnormal findings: Secondary | ICD-10-CM | POA: Diagnosis not present

## 2014-05-03 DIAGNOSIS — I10 Essential (primary) hypertension: Secondary | ICD-10-CM

## 2014-05-03 DIAGNOSIS — K429 Umbilical hernia without obstruction or gangrene: Secondary | ICD-10-CM

## 2014-05-03 DIAGNOSIS — N500 Atrophy of testis: Secondary | ICD-10-CM | POA: Diagnosis not present

## 2014-05-03 LAB — COMPREHENSIVE METABOLIC PANEL
ALK PHOS: 66 U/L (ref 39–117)
ALT: 28 U/L (ref 0–53)
AST: 34 U/L (ref 0–37)
Albumin: 3.8 g/dL (ref 3.5–5.2)
BILIRUBIN TOTAL: 0.4 mg/dL (ref 0.2–1.2)
BUN: 10 mg/dL (ref 6–23)
CALCIUM: 9.3 mg/dL (ref 8.4–10.5)
CO2: 28 mEq/L (ref 19–32)
Chloride: 101 mEq/L (ref 96–112)
Creat: 1.2 mg/dL (ref 0.50–1.35)
Glucose, Bld: 82 mg/dL (ref 70–99)
POTASSIUM: 3.9 meq/L (ref 3.5–5.3)
SODIUM: 135 meq/L (ref 135–145)
TOTAL PROTEIN: 7.1 g/dL (ref 6.0–8.3)

## 2014-05-03 LAB — CBC WITH DIFFERENTIAL/PLATELET
BASOS ABS: 0 10*3/uL (ref 0.0–0.1)
BASOS PCT: 0 % (ref 0–1)
EOS ABS: 0.1 10*3/uL (ref 0.0–0.7)
Eosinophils Relative: 2 % (ref 0–5)
HCT: 41 % (ref 39.0–52.0)
Hemoglobin: 14 g/dL (ref 13.0–17.0)
Lymphocytes Relative: 44 % (ref 12–46)
Lymphs Abs: 2.6 10*3/uL (ref 0.7–4.0)
MCH: 29 pg (ref 26.0–34.0)
MCHC: 34.1 g/dL (ref 30.0–36.0)
MCV: 85.1 fL (ref 78.0–100.0)
MONO ABS: 0.5 10*3/uL (ref 0.1–1.0)
MPV: 10.9 fL (ref 8.6–12.4)
Monocytes Relative: 8 % (ref 3–12)
NEUTROS ABS: 2.7 10*3/uL (ref 1.7–7.7)
Neutrophils Relative %: 46 % (ref 43–77)
PLATELETS: 217 10*3/uL (ref 150–400)
RBC: 4.82 MIL/uL (ref 4.22–5.81)
RDW: 15.1 % (ref 11.5–15.5)
WBC: 5.8 10*3/uL (ref 4.0–10.5)

## 2014-05-03 LAB — LIPID PANEL
CHOLESTEROL: 151 mg/dL (ref 0–200)
HDL: 30 mg/dL — ABNORMAL LOW (ref >=40)
LDL CALC: 98 mg/dL (ref 0–99)
TRIGLYCERIDES: 115 mg/dL (ref <150)
Total CHOL/HDL Ratio: 5 Ratio
VLDL: 23 mg/dL (ref 0–40)

## 2014-05-03 MED ORDER — LISINOPRIL-HYDROCHLOROTHIAZIDE 20-12.5 MG PO TABS
1.0000 | ORAL_TABLET | Freq: Every day | ORAL | Status: DC
Start: 2014-05-03 — End: 2015-05-20

## 2014-05-03 MED ORDER — AMLODIPINE BESYLATE 10 MG PO TABS: 10.0000 mg | ORAL_TABLET | Freq: Every day | ORAL | Status: DC

## 2014-05-03 NOTE — Progress Notes (Signed)
Subjective:    Patient ID: Parker Clay, male    DOB: 1970-05-17, 44 y.o.   MRN: [id number]  HPI He is here for complete examination. He continues on his blood pressure medication and is having no difficulty with that. He does complain of periumbilical swelling and does have a previous history of hernia. He would like to get this taken care of. His exercise and eating habits are unchanged. Family and social history was reviewed. His work is going well. He does have 4 children. He is not married but living with his girlfriend.   Review of Systems  All other systems reviewed and are negative.      Objective:   Physical Exam BP 120/76 mmHg  Pulse 78  Ht 5\' 11"  (1.803 m)  Wt 336 lb (152.409 kg)  BMI 46.88 kg/m2  SpO2 98%  General Appearance:    Alert, cooperative, no distress, appears stated age  Head:    Normocephalic, without obvious abnormality, atraumatic  Eyes:    PERRL, conjunctiva/corneas clear, EOM's intact, fundi    benign  Ears:    Normal TM's and external ear canals  Nose:   Nares normal, mucosa normal, no drainage or sinus   tenderness  Throat:   Lips, mucosa, and tongue normal; teeth and gums normal  Neck:   Supple, no lymphadenopathy;  thyroid:  no   enlargement/tenderness/nodules; no carotid   bruit or JVD  Back:    Spine nontender, no curvature, ROM normal, no CVA     tenderness  Lungs:     Clear to auscultation bilaterally without wheezes, rales or     ronchi; respirations unlabored  Chest Wall:    No tenderness or deformity   Heart:    Regular rate and rhythm, S1 and S2 normal, no murmur, rub   or gallop  Breast Exam:    No chest wall tenderness, masses or gynecomastia  Abdomen:     Soft, non-tender, nondistended, normoactive bowel sounds,    Periumbilical hernia is noted, no hepatosplenomegaly  Genitalia:    Normal male external genitalia without lesions.  Testicle On the left is normal, right is atrophied.  No inguinal hernias.  Rectal:    Normal sphincter  tone, no masses or tenderness; guaiac negative stool.  Prostate smooth, no nodules, not enlarged.  Extremities:   No clubbing, cyanosis or edema  Pulses:   2+ and symmetric all extremities  Skin:   Skin color, texture, turgor normal, no rashes or lesions  Lymph nodes:   Cervical, supraclavicular, and axillary nodes normal  Neurologic:   CNII-XII intact, normal strength, sensation and gait; reflexes 2+ and symmetric throughout          Psych:   Normal mood, affect, hygiene and grooming.          Assessment & Plan:  Routine general medical examination at a health care facility - Plan: CBC with Differential/Platelet, Comprehensive metabolic panel, Lipid panel, POCT occult blood stool  Obesity, Class III, BMI 40-49.9 (morbid obesity) - Plan: CBC with Differential/Platelet, Comprehensive metabolic panel  Essential hypertension - Plan: CBC with Differential/Platelet, Comprehensive metabolic panel, amLODipine (NORVASC) 10 MG tablet, lisinopril-hydrochlorothiazide (PRINZIDE,ZESTORETIC) 20-12.5 MG per tablet  Umbilical hernia, recurrence not specified - Plan: Ambulatory referral to General Surgery  Testicular atrophy Stressed the need for him to make diet and exercise changes on a permanent basis. Discussed possibly referring him to nutritionist however he is not interested. Continue on present medication regimen.

## 2014-05-20 ENCOUNTER — Other Ambulatory Visit: Payer: Self-pay | Admitting: General Surgery

## 2014-05-20 DIAGNOSIS — K429 Umbilical hernia without obstruction or gangrene: Secondary | ICD-10-CM

## 2014-05-20 DIAGNOSIS — K439 Ventral hernia without obstruction or gangrene: Secondary | ICD-10-CM

## 2014-05-26 ENCOUNTER — Ambulatory Visit
Admission: RE | Admit: 2014-05-26 | Discharge: 2014-05-26 | Disposition: A | Discharge status: Home / Self Care | Payer: 59 | Source: Ambulatory Visit | Attending: General Surgery | Admitting: General Surgery

## 2014-05-26 MED ORDER — IOPAMIDOL (ISOVUE-300) INJECTION 61%
125.0000 mL | Freq: Once | INTRAVENOUS | Status: AC | PRN
  Administered 2014-05-26: 125 mL via INTRAVENOUS

## 2014-08-04 ENCOUNTER — Emergency Department (HOSPITAL_COMMUNITY): Payer: 59

## 2014-08-04 ENCOUNTER — Encounter (HOSPITAL_COMMUNITY): Payer: Self-pay | Admitting: Emergency Medicine

## 2014-08-04 ENCOUNTER — Emergency Department (HOSPITAL_COMMUNITY)
Admission: EM | Admit: 2014-08-04 | Discharge: 2014-08-04 | Disposition: A | Discharge status: Home / Self Care | Payer: 59 | Attending: Emergency Medicine | Admitting: Emergency Medicine

## 2014-08-04 DIAGNOSIS — Y999 Unspecified external cause status: Secondary | ICD-10-CM | POA: Insufficient documentation

## 2014-08-04 DIAGNOSIS — I10 Essential (primary) hypertension: Secondary | ICD-10-CM | POA: Insufficient documentation

## 2014-08-04 DIAGNOSIS — X58XXXA Exposure to other specified factors, initial encounter: Secondary | ICD-10-CM | POA: Insufficient documentation

## 2014-08-04 DIAGNOSIS — E669 Obesity, unspecified: Secondary | ICD-10-CM | POA: Insufficient documentation

## 2014-08-04 DIAGNOSIS — Y9301 Activity, walking, marching and hiking: Secondary | ICD-10-CM | POA: Insufficient documentation

## 2014-08-04 DIAGNOSIS — S92352A Displaced fracture of fifth metatarsal bone, left foot, initial encounter for closed fracture: Secondary | ICD-10-CM | POA: Insufficient documentation

## 2014-08-04 DIAGNOSIS — S92302A Fracture of unspecified metatarsal bone(s), left foot, initial encounter for closed fracture: Secondary | ICD-10-CM

## 2014-08-04 DIAGNOSIS — Z79899 Other long term (current) drug therapy: Secondary | ICD-10-CM | POA: Insufficient documentation

## 2014-08-04 DIAGNOSIS — Z720 Tobacco use: Secondary | ICD-10-CM | POA: Insufficient documentation

## 2014-08-04 DIAGNOSIS — S99922A Unspecified injury of left foot, initial encounter: Secondary | ICD-10-CM

## 2014-08-04 DIAGNOSIS — Y9289 Other specified places as the place of occurrence of the external cause: Secondary | ICD-10-CM | POA: Diagnosis not present

## 2014-08-04 DIAGNOSIS — M25572 Pain in left ankle and joints of left foot: Secondary | ICD-10-CM

## 2014-08-04 DIAGNOSIS — S99912A Unspecified injury of left ankle, initial encounter: Secondary | ICD-10-CM | POA: Diagnosis present

## 2014-08-04 MED ORDER — HYDROCODONE-ACETAMINOPHEN 5-325 MG PO TABS
1.0000 | ORAL_TABLET | Freq: Once | ORAL | Status: AC
  Administered 2014-08-04: 1 via ORAL
  Filled 2014-08-04: qty 1

## 2014-08-04 MED ORDER — HYDROCODONE-ACETAMINOPHEN 5-325 MG PO TABS: 1.0000 | ORAL_TABLET | Freq: Four times a day (QID) | ORAL | Status: DC | PRN

## 2014-08-04 MED ORDER — NAPROXEN 500 MG PO TABS: 500.0000 mg | ORAL_TABLET | Freq: Two times a day (BID) | ORAL | Status: DC | PRN

## 2014-08-04 NOTE — Discharge Instructions (Signed)
Wear cam walker and use crutches for all weight bearing activities. Ice and elevate foot/ankle throughout the day. Alternate between naprosyn and norco for pain relief. Do not drive or operate machinery with pain medication use. Call orthopedic follow up today or tomorrow to schedule followup appointment for recheck in 1 week. Return to the ER for changes or worsening symptoms.    Metatarsal Fracture, Undisplaced A metatarsal fracture is a break in the bone(s) of the foot. These are the bones of the foot that connect your toes to the bones of the ankle. DIAGNOSIS  The diagnoses of these fractures are usually made with X-rays. If there are problems in the forefoot and x-rays are normal a later bone scan will usually make the diagnosis.  TREATMENT AND HOME CARE INSTRUCTIONS  Treatment may or may not include a cast or walking shoe. When casts are needed the use is usually for short periods of time so as not to slow down healing with muscle wasting (atrophy).  Activities should be stopped until further advised by your caregiver.  Wear shoes with adequate shock absorbing capabilities and stiff soles.  Alternative exercise may be undertaken while waiting for healing. These may include bicycling and swimming, or as your caregiver suggests.  It is important to keep all follow-up visits or specialty referrals. The failure to keep these appointments could result in improper bone healing and chronic pain or disability.  Warning: Do not drive a car or operate a motor vehicle until your caregiver specifically tells you it is safe to do so. IF YOU DO NOT HAVE A CAST OR SPLINT:  You may walk on your injured foot as tolerated or advised.  Do not put any weight on your injured foot for as long as directed by your caregiver. Slowly increase the amount of time you walk on the foot as the pain allows or as advised.  Use crutches until you can bear weight without pain. A gradual increase in weight bearing may  help.  Apply ice to the injury for 15-20 minutes each hour while awake for the first 2 days. Put the ice in a plastic bag and place a towel between the bag of ice and your skin.  Only take over-the-counter or prescription medicines for pain, discomfort, or fever as directed by your caregiver. SEEK IMMEDIATE MEDICAL CARE IF:   Your cast gets damaged or breaks.  You have continued severe pain or more swelling than you did before the cast was put on, or the pain is not controlled with medications.  Your skin or nails below the injury turn blue or grey, or feel cold or numb.  There is a bad smell, or new stains or pus-like (purulent) drainage coming from the cast. MAKE SURE YOU:   Understand these instructions.  Will watch your condition.  Will get help right away if you are not doing well or get worse. Document Released: 10/27/2001 Document Revised: 04/29/2011 Document Reviewed: 09/18/2007 [facility] Patient Information 2015 [place], [place]. This information is not intended to replace advice given to you by your health care provider. Make sure you discuss any questions you have with your health care provider.  Cryotherapy Cryotherapy is when you put ice on your injury. Ice helps lessen pain and puffiness (swelling) after an injury. Ice works the best when you start using it in the first 24 to 48 hours after an injury. HOME CARE  Put a dry or damp towel between the ice pack and your skin.  You may  press gently on the ice pack.  Leave the ice on for no more than 10 to 20 minutes at a time.  Check your skin after 5 minutes to make sure your skin is okay.  Rest at least 20 minutes between ice pack uses.  Stop using ice when your skin loses feeling (numbness).  Do not use ice on someone who cannot tell you when it hurts. This includes small children and people with memory problems (dementia). GET HELP RIGHT AWAY IF:  You have white spots on your skin.  Your skin turns blue or  pale.  Your skin feels waxy or hard.  Your puffiness gets worse. MAKE SURE YOU:   Understand these instructions.  Will watch your condition.  Will get help right away if you are not doing well or get worse. Document Released: 07/24/2007 Document Revised: 04/29/2011 Document Reviewed: 09/27/2010 [facility] Patient Information 2015 [place], [place]. This information is not intended to replace advice given to you by your health care provider. Make sure you discuss any questions you have with your health care provider.

## 2014-08-04 NOTE — ED Provider Notes (Signed)
CSN: [id number]     Arrival date & time 08/04/14  1412 History  This chart was scribed for non-physician practitioner Allen Derry, PA-C working with Tilden Fossa, MD by Murriel Hopper, ED Scribe. This patient was seen in room WTR6/WTR6 and the patient's care was started at 2:39 PM.  Chief Complaint  Patient presents with  . Ankle Pain    twisted l/ankle 2 hours ago      Patient is a 44 y.o. male presenting with ankle pain. The history is provided by the patient. No language interpreter was used.  Ankle Pain Location:  Ankle Time since incident:  2 hours Injury: yes   Mechanism of injury comment:  Twisted ankle Ankle location:  L ankle Pain details:    Quality:  Throbbing   Radiates to:  Does not radiate   Severity:  Severe   Onset quality:  Sudden   Duration:  2 hours   Timing:  Constant   Progression:  Unchanged Chronicity:  New Relieved by:  None tried Worsened by:  Bearing weight Ineffective treatments:  None tried Associated symptoms: decreased ROM (due to pain) and swelling   Associated symptoms: no back pain, no muscle weakness, no neck pain, no numbness and no tingling    HPI Comments: Parker Clay is a 44 y.o. male with a PMHx of HTN and obesity, who presents to the Emergency Department complaining of constant nonradiating throbbing left ankle pain that pt rates as 10/10 in severity that has been present for 2 after pt twisted ankle when he stepped in a hole. Pt states he was able to walk for the first hour after the incident occurred, but is not able to ambulate now due to pain. Pain worsened with ambulation and has not tried anything PTA for pain. Pt denies numbness and tingling, denies hitting head or losing consciousness during the fall. Denies any other complaints. NKDA  Past Medical History  Diagnosis Date  . Obesity   . Hypertension    History reviewed. No pertinent past surgical history. Family History  Problem Relation Age of Onset  . Cancer  Mother 39    breast  . Cancer Father 44    prostate   History  Substance Use Topics  . Smoking status: Current Some Day Smoker    Types: Cigars  . Smokeless tobacco: Never Used  . Alcohol Use: Yes     Comment: occasionally    Review of Systems  HENT: Negative for facial swelling (no head injury ).   Respiratory: Negative for shortness of breath.   Cardiovascular: Negative for chest pain.  Gastrointestinal: Negative for nausea, vomiting and abdominal pain.  Musculoskeletal: Positive for joint swelling (L ankle) and arthralgias (L ankle/foot). Negative for myalgias, back pain and neck pain.  Skin: Negative for color change and wound.  Allergic/Immunologic: Negative for immunocompromised state.  Neurological: Negative for syncope, weakness and numbness.  Psychiatric/Behavioral: Negative for confusion.    10 Systems reviewed and all are negative for acute change except as noted in the HPI.    Allergies  Review of patient's allergies indicates no known allergies.  Home Medications   Prior to Admission medications   Medication Sig Start Date End Date Taking? Authorizing Provider  amLODipine (NORVASC) 10 MG tablet Take 1 tablet (10 mg total) by mouth daily. 05/03/14   Ronnald Nian, MD  lisinopril-hydrochlorothiazide (PRINZIDE,ZESTORETIC) 20-12.5 MG per tablet Take 1 tablet by mouth daily. 05/03/14   Ronnald Nian, MD   BP 104/57 mmHg  Pulse 79  Temp(Src) 98.5 F (36.9 C) (Oral)  Resp 20  SpO2 96% Physical Exam  Constitutional: He is oriented to person, place, and time. Vital signs are normal. He appears well-developed and well-nourished.  Non-toxic appearance. No distress.  Afebrile, nontoxic, NAD  HENT:  Head: Normocephalic and atraumatic.  Mouth/Throat: Mucous membranes are normal.  Eyes: Conjunctivae and EOM are normal. Right eye exhibits no discharge. Left eye exhibits no discharge.  Neck: Normal range of motion. Neck supple.  Cardiovascular: Normal rate and intact  distal pulses.   Pulmonary/Chest: Effort normal. No respiratory distress.  Abdominal: Normal appearance. He exhibits no distension.  Musculoskeletal:       Left ankle: He exhibits decreased range of motion (due to pain) and swelling. He exhibits no deformity and normal pulse. Tenderness. Lateral malleolus and medial malleolus tenderness found. Achilles tendon normal.       Feet:  L ankle with limited ROM due to pain, mild lateral malleoli swelling without deformity, with mild TTP of lateral and medial malleoli extending towards 4-5th metatarsals but no TTP or swelling of remainder of fore foot or calf. No break in skin. No bruising or erythema. No warmth. Achilles intact. Good pedal pulse and cap refill of all toes. Wiggling toes without difficulty. Sensation grossly intact.   Neurological: He is alert and oriented to person, place, and time. He has normal strength. No sensory deficit.  Skin: Skin is warm, dry and intact. No rash noted.  Psychiatric: He has a normal mood and affect.  Nursing note and vitals reviewed.   ED Course  Procedures (including critical care time)  DIAGNOSTIC STUDIES: Oxygen Saturation is 96% on room air, normal by my interpretation.    COORDINATION OF CARE: 2:45 PM Discussed treatment plan with pt at bedside and pt agreed to plan.  Labs Review Labs Reviewed - No data to display  Imaging Review Dg Ankle Complete Left  08/04/2014   CLINICAL DATA:  44 year old male with history of left ankle pain after stepping into a hole earlier today.  EXAM: LEFT ANKLE COMPLETE - 3+ VIEW  COMPARISON:  [facility].  FINDINGS: Mild diffuse soft tissue swelling around the ankle, most evident overlying the lateral malleolus. No acute displaced fracture, subluxation or dislocation. Small exostosis off the lateral aspect of the distal third of the tibial diaphysis incidentally noted.  IMPRESSION: 1. Soft tissue swelling around the ankle joint, without underlying acute bony trauma.    Electronically Signed   By: Trudie Reed M.D.   On: 08/04/2014 15:02   Dg Foot Complete Left  08/04/2014   CLINICAL DATA:  Initial encounter for left-sided pain after trauma.  EXAM: LEFT FOOT - COMPLETE 3+ VIEW  COMPARISON:  None  FINDINGS: Small Achilles and calcaneal spurs. Subtle osseous irregularity at the fifth metatarsal head/neck junction, only on the oblique image.  IMPRESSION: Subtle osseous irregularity at the distal fifth metatarsal. Favored to be within normal variation and only apparent on the oblique image. Correlate with point tenderness to exclude a nondisplaced fracture.   Electronically Signed   By: Jeronimo Greaves M.D.   On: 08/04/2014 15:04     EKG Interpretation None      MDM   Final diagnoses:  Foot injury, left, initial encounter  Fracture of fifth metatarsal bone, left, closed, initial encounter  Ankle pain, left    44 y.o. male here with L ankle and foot pain after twisting it 2hrs PTA. Neurovascularly intact with soft compartments. Tenderness to both malleoli and mildly  tender in 4-5th metatarsal region. Will obtain xray imaging. No skin openings. Will give pain meds and reassess.  3:27 PM Xray showing irregularity to distal 5th metatarsal, given his injury and tenderness, will treat conservatively as a fx. Discussed RICE. Will apply cam walker and give crutches. Will give pain meds for home. Will have him f/up with ortho in 1 wk for recheck. I explained the diagnosis and have given explicit precautions to return to the ER including for any other new or worsening symptoms. The patient understands and accepts the medical plan as it's been dictated and I have answered their questions. Discharge instructions concerning home care and prescriptions have been given. The patient is STABLE and is discharged to home in good condition.  I personally performed the services described in this documentation, which was scribed in my presence. The recorded information has been  reviewed and is accurate.  BP 104/57 mmHg  Pulse 79  Temp(Src) 98.5 F (36.9 C) (Oral)  Resp 20  SpO2 96%  Meds ordered this encounter  Medications  . HYDROcodone-acetaminophen (NORCO/VICODIN) 5-325 MG per tablet 1 tablet    Sig:   . naproxen (NAPROSYN) 500 MG tablet    Sig: Take 1 tablet (500 mg total) by mouth 2 (two) times daily as needed for mild pain, moderate pain or headache (TAKE WITH MEALS.).    Dispense:  20 tablet    Refill:  0    Order Specific Question:  Supervising Provider    Answer:  MILLER, BRIAN [[id number]]  . HYDROcodone-acetaminophen (NORCO) 5-325 MG per tablet    Sig: Take 1 tablet by mouth every 6 (six) hours as needed for severe pain.    Dispense:  10 tablet    Refill:  0    Order Specific Question:  Supervising Provider    Answer:  Eber Hong [[id number]]     Nomi Rudnicki Camprubi-Soms, PA-C 08/04/14 1528  Tilden Fossa, MD 08/04/14 [phone]

## 2014-08-04 NOTE — ED Notes (Signed)
Pt stepping into a hole in a grassy area. L/ankle swollen and throbbing. Unable to bear weight on l/foot at this time

## 2014-10-14 ENCOUNTER — Emergency Department (HOSPITAL_COMMUNITY)
Admission: EM | Admit: 2014-10-14 | Discharge: 2014-10-14 | Disposition: A | Discharge status: Home / Self Care | Payer: 59 | Attending: Emergency Medicine | Admitting: Emergency Medicine

## 2014-10-14 ENCOUNTER — Encounter (HOSPITAL_COMMUNITY): Payer: Self-pay | Admitting: Emergency Medicine

## 2014-10-14 DIAGNOSIS — H2012 Chronic iridocyclitis, left eye: Secondary | ICD-10-CM | POA: Diagnosis not present

## 2014-10-14 DIAGNOSIS — Z720 Tobacco use: Secondary | ICD-10-CM | POA: Insufficient documentation

## 2014-10-14 DIAGNOSIS — Z79899 Other long term (current) drug therapy: Secondary | ICD-10-CM | POA: Diagnosis not present

## 2014-10-14 DIAGNOSIS — H5712 Ocular pain, left eye: Secondary | ICD-10-CM | POA: Diagnosis present

## 2014-10-14 DIAGNOSIS — E669 Obesity, unspecified: Secondary | ICD-10-CM | POA: Diagnosis not present

## 2014-10-14 DIAGNOSIS — I10 Essential (primary) hypertension: Secondary | ICD-10-CM | POA: Diagnosis not present

## 2014-10-14 MED ORDER — SCOPOLAMINE HBR 0.25 % OP SOLN: 2.0000 [drp] | Freq: Four times a day (QID) | OPHTHALMIC | Status: DC

## 2014-10-14 MED ORDER — NEOMYCIN-POLYMYXIN-DEXAMETH 3.5-10000-0.1 OP SUSP: 2.0000 [drp] | Freq: Four times a day (QID) | OPHTHALMIC | Status: DC

## 2014-10-14 NOTE — ED Provider Notes (Signed)
CSN: [id number]     Arrival date & time 10/14/14  1044 History  This chart was scribed for non-physician practitioner Allen Derry, PA-C working with Benjiman Core, MD by Littie Deeds, ED Scribe. This patient was seen in room TR04C/TR04C and the patient's care was started at 10:57 AM.      Chief Complaint  Patient presents with  . Eye Pain   Patient is a 44 y.o. male presenting with eye pain. The history is provided by the patient. No language interpreter was used.  Eye Pain This is a recurrent problem. The current episode started yesterday. The problem occurs constantly. The problem has not changed since onset.Pertinent negatives include no chest pain, no abdominal pain, no headaches and no shortness of breath. Exacerbated by: light, adjusting pupils. Nothing relieves the symptoms. He has tried nothing for the symptoms. The treatment provided no relief.   HPI Comments: Parker Clay is a 44 y.o. male with a history of iritis/uveitis and HTN, who presents to the Emergency Department complaining of gradual onset, constant, aching left eye pain that started yesterday. The pain is described as a 5/10 in severity currently, but a 8-9/10 when most severe. He states the pain is worse when the pupil is adjusting with light exposure. Patient reports having associated left eye redness and photophobia. He denies foreign body sensation in his eye or any injury. He also denies visual changes, eye itching/drainage, headache, dizziness, lightheadedness, fever, chills, chest pain, SOB, abdominal pain, nausea, vomiting, dysuria, hematuria, myalgias, arthralgias, numbness, tingling, weakness, rhinorrhea, ear symptoms, and sore throat. Patient does wear glasses, but he did not bring them today. He does not wear contacts.  Ophthalmologist: Dr. Elmer Picker. He has been diagnosed with idiopathic iritis, has flares approx once per year, this feels the same. Has had multiple checks for glaucoma and never had any  increased IOP.  Past Medical History  Diagnosis Date  . Obesity   . Hypertension    History reviewed. No pertinent past surgical history. Family History  Problem Relation Age of Onset  . Cancer Mother 41    breast  . Cancer Father 84    prostate   Social History  Substance Use Topics  . Smoking status: Current Some Day Smoker    Types: Cigars  . Smokeless tobacco: Never Used  . Alcohol Use: Yes     Comment: occasionally    Review of Systems  Constitutional: Negative for fever and chills.  HENT: Negative for ear discharge, ear pain, rhinorrhea and sore throat.   Eyes: Positive for photophobia, pain and redness. Negative for discharge, itching and visual disturbance.  Respiratory: Negative for shortness of breath.   Cardiovascular: Negative for chest pain.  Gastrointestinal: Negative for nausea, vomiting and abdominal pain.  Genitourinary: Negative for dysuria and hematuria.  Musculoskeletal: Negative for myalgias and arthralgias.  Skin: Negative for color change.  Allergic/Immunologic: Negative for immunocompromised state.  Neurological: Negative for dizziness, weakness, light-headedness, numbness and headaches.   10 Systems reviewed and are negative for acute change except as noted in the HPI.    Allergies  Review of patient's allergies indicates no known allergies.  Home Medications   Prior to Admission medications   Medication Sig Start Date End Date Taking? Authorizing Provider  amLODipine (NORVASC) 10 MG tablet Take 1 tablet (10 mg total) by mouth daily. 05/03/14   Ronnald Nian, MD  HYDROcodone-acetaminophen (NORCO) 5-325 MG per tablet Take 1 tablet by mouth every 6 (six) hours as needed for severe pain.  08/04/14   Nevayah Faust Camprubi-Soms, PA-C  lisinopril-hydrochlorothiazide (PRINZIDE,ZESTORETIC) 20-12.5 MG per tablet Take 1 tablet by mouth daily. 05/03/14   Ronnald Nian, MD  naproxen (NAPROSYN) 500 MG tablet Take 1 tablet (500 mg total) by mouth 2 (two) times  daily as needed for mild pain, moderate pain or headache (TAKE WITH MEALS.). 08/04/14   Knox Holdman Camprubi-Soms, PA-C   BP 139/83 mmHg  Pulse 84  Temp(Src) 97.6 F (36.4 C)  Resp 19  Ht 5' 11.5" (1.816 m)  Wt 325 lb (147.419 kg)  BMI 44.70 kg/m2  SpO2 100% Physical Exam  Constitutional: He is oriented to person, place, and time. Vital signs are normal. He appears well-developed and well-nourished.  Non-toxic appearance. No distress.  Afebrile, nontoxic, NAD  HENT:  Head: Normocephalic and atraumatic.  Mouth/Throat: Mucous membranes are normal.  Eyes: EOM are normal. Pupils are equal, round, and reactive to light. Lids are everted and swept, no foreign bodies found. Right eye exhibits no discharge. Left eye exhibits no discharge. No foreign body present in the left eye. Left conjunctiva is injected.  PERRL although left eye with mitotic poorly reactive pupil, EOMI, no nystagmus, no visual field deficits. Left eye conjunctiva injected with perilimbal flush, no foreign bodies visualized with lid eversion and sweeping. No drainage or crusting. +Consensual pain.  Neck: Normal range of motion. Neck supple.  Cardiovascular: Normal rate.   Pulmonary/Chest: Effort normal. No respiratory distress.  Abdominal: Normal appearance. He exhibits no distension.  Musculoskeletal: Normal range of motion.  Neurological: He is alert and oriented to person, place, and time. He has normal strength. No sensory deficit.  Skin: Skin is warm, dry and intact. No rash noted.  Psychiatric: He has a normal mood and affect.  Nursing note and vitals reviewed.   ED Course  Procedures  DIAGNOSTIC STUDIES: Oxygen Saturation is 100% on room air, normal by my interpretation.    COORDINATION OF CARE: 11:02 AM-Discussed treatment plan which includes medications with patient/guardian at bedside and patient/guardian agreed to plan.    Labs Review Labs Reviewed - No data to display  Imaging Review No results found. I  have personally reviewed and evaluated these images and lab results as part of my medical decision-making.   EKG Interpretation None      MDM   Final diagnoses:  Iritis, chronic, left    44 y.o. male here with L eye pain, recurrent iritis, feels the same as it usually does with his iritis flares. Multiple ophthalmology visits in the last few years have never found a cause. No hx of glaucoma or abnormal IOP in the past. No drainage. No vision changes. Signs of iritis on exam. Doubt corneal abrasion, doubt conjunctivitis although this is on the differential given unilateral eye redness which is painful, but no discharge makes it less likely. Will treat with scopolamine drops and maxtriol. F/up with his ophthalmologist in 1 day. I explained the diagnosis and have given explicit precautions to return to the ER including for any other new or worsening symptoms. The patient understands and accepts the medical plan as it's been dictated and I have answered their questions. Discharge instructions concerning home care and prescriptions have been given. The patient is STABLE and is discharged to home in good condition.   I personally performed the services described in this documentation, which was scribed in my presence. The recorded information has been reviewed and is accurate.  BP 139/83 mmHg  Pulse 84  Temp(Src) 97.6 F (36.4 C)  Resp  19  Ht 5' 11.5" (1.816 m)  Wt 325 lb (147.419 kg)  BMI 44.70 kg/m2  SpO2 100%  Meds ordered this encounter  Medications  . scopolamine (HYOSCINE) 0.25 % ophthalmic solution    Sig: Place 2 drops into the left eye 4 (four) times daily.    Dispense:  5 mL    Refill:  0    Order Specific Question:  Supervising Provider    Answer:  MILLER, BRIAN [[id number]]  . neomycin-polymyxin b-dexamethasone (MAXITROL) 3.5-10000-0.1 SUSP    Sig: Place 2 drops into the left eye every 6 (six) hours.    Dispense:  1 Bottle    Refill:  0    Order Specific Question:  Supervising  Provider    Answer:  Evlyn Kanner, PA-C 10/14/14 [address] PA-C 10/14/14 1114  Benjiman Core, MD 10/19/14 [phone]

## 2014-10-14 NOTE — ED Notes (Signed)
Patient states L eye redness, light sensitivity, watery x 2 days.   Patient denies any other symptoms.

## 2014-10-14 NOTE — Discharge Instructions (Signed)
Use eye drops as directed. Call your ophthalmologist today and schedule an appointment for tomorrow. Return to the ER for changes or worsening symptoms.   Iritis Iritis is when the colored part of the eye (iris) is red and painful (inflamed). Light might seem to hurt your eyes (light sensitivity). You may have blurred vision or start to tear up. It is important to treat this problem early.  HOME CARE  Use eyedrops or pills (corticosteroid medicine) as told by your doctor.  Only take medicine as told by your doctor. GET HELP RIGHT AWAY IF:   You have redness in one or both eyes.  Light seems to hurt your eyes.  You have pain in one or both eyes. MAKE SURE YOU:   Understand these instructions.  Will watch your condition.  Will get help right away if you are not doing well or get worse. Document Released: 05/01/2009 Document Revised: 04/29/2011 Document Reviewed: 05/01/2009 [facility] Patient Information 2015 [place], [place]. This information is not intended to replace advice given to you by your health care provider. Make sure you discuss any questions you have with your health care provider.  Uveitis The uveal tract (uvea) is a layer of the eye made up of three structures:   The choroid - a layer containing the eye's blood vessels located between the outer white part of the eyeball (sclera) and the inner layer (retina).  The iris - the colored portion of the eye.  The ciliary body - a area of muscle at the base of the iris that helps focus the lens. Uveitis happens when any or all portions of the uveal tract become inflamed.   Iritis is when the iris becomes inflamed. It is the most common form of uveitis. It is extremely important to treat iritis early, as it may lead to internal eye damage causing scarring or diseases such as glaucoma. Some people have only one attack of iritis (in one or both eyes) in their lifetime, while others may get it many times.  Pars planitis is when  the ciliary body becomes inflamed.  Choroiditis is when the choroid layer becomes inflamed. If the inflammation also involves the retina, it is called chorioretinitis.  Panuveitis is when inflammation affects all of the structures of the uveal tract. CAUSES   Diseases where the body's immune system attacks tissues within your own body (autoimmune diseases).  Infections (tuberculosis, gonorrhea, fungus infections, Lyme disease, infection of the lining of the heart).  Trauma or injury.  Eye diseases (acute glaucoma and others).  Inflammation from other parts of the uveal track.  Severe eye infections.  Other rare diseases. SYMPTOMS  Iritis  Eye pain or aching.  Light sensitivity.  Loss of sight or blurred vision.  Redness of the eye. This is often accompanied by a ring of redness around the outside of the cornea or clear covering at the front of the eye (ciliary flush).  Excessive tearing of the eye(s).  A small pupil that does not enlarge in the dark and stays smaller than the other eye's pupil.  A whitish area that obscures the lower part of the colored iris. Sometimes this is visible when looking at the eye. Since iritis causes the eye to become red, it is often confused with a much less dangerous form of "pink eye" or conjunctivitis. One of the most important symptoms is sensitivity to light. Anytime there is redness, discomfort in the eye(s) and extreme light sensitivity, it is extremely important to see an ophthalmologist  as soon as possible. Pars Planitis  Mild, progressive drop in vision.  Floating spots in the field of vision in front of the eye.  Development of a cataract. Choroiditis  May have no symptoms.  Progressive drop in vision.  Loss of vision in an isolated area of peripheral vision (scotoma) or to the side.  Sore, aching eye. TREATMENT  Iritis  Corticosteroid eye drops and dilating agent eye drops are often given. Corticosteroid eye drops are  used to decrease inflammation. Dilating drops help to prevent scarring of the iris and the risk of the iris becoming "stuck" to the front surface of the lens. Follow your caregivers exact instructions on taking and stopping corticosteroid medications (drops or pills).  Sometimes, the iritis will be so severe that it will not respond to commonly used medications. If this happens, it may be necessary to use steroid injections. The injections are given under the eye's outer surface. Sometimes oral medications are given. Treatment used for iritis is usually made on an individual basis. Pars Planitis  If mild, no treatment may be required  Corticosteroids are usually given orally or for severe cases, injections are given under the surface of the eye. Choroiditis  If associated with an underlying disease, the disease itself is treated.  Corticosteroids are usually administered by mouth (orally).  Corticosteroid injections or other medicines specific are given to treat the cause of infection or inflammation. These injections are given into the interior cavity of the eye (intravitreal injection).  If caused by a parasite or worm that can be seen by the ophthalmologists, laser treatments may be used to kill the parasite. Panuveitis  Treatment depends upon the cause and severity of the inflammation. It may consist of any one, or a combination of the treatments outlined above. HOME CARE INSTRUCTIONS  Your caregiver will give specific instructions regarding the use of eye medications or other medications. Follow all instructions in both taking and stopping the medications. SEEK IMMEDIATE MEDICAL CARE IF:  You have redness of one or both eyes.  You experience a great deal of light sensitivity.  You have pain or aching in either eye.  You notice a drop in vision in either eye. MAKE SURE YOU:   Understand these instructions.  Will watch your condition.  Will get help right away if you are not  doing well or get worse. Document Released: 05/03/2008 Document Revised: 04/29/2011 Document Reviewed: 05/03/2008 [facility] Patient Information 2015 [place], [place]. This information is not intended to replace advice given to you by your health care provider. Make sure you discuss any questions you have with your health care provider.

## 2014-10-27 ENCOUNTER — Telehealth: Payer: Self-pay | Admitting: Family Medicine

## 2014-10-27 NOTE — Telephone Encounter (Signed)
ER letter 

## 2015-05-20 ENCOUNTER — Other Ambulatory Visit: Payer: Self-pay | Admitting: Family Medicine

## 2015-05-22 ENCOUNTER — Other Ambulatory Visit: Payer: Self-pay | Admitting: Family Medicine

## 2015-05-22 NOTE — Telephone Encounter (Signed)
Left a detailed message on pt vm that he is due for a cpe and his last one was march 2016 and i will only refill his bp med for 30 days so he was advised needs to call back to schedule an appt.

## 2015-11-13 ENCOUNTER — Other Ambulatory Visit: Payer: Self-pay | Admitting: Family Medicine

## 2016-03-20 ENCOUNTER — Encounter: Payer: 59 | Admitting: Family Medicine

## 2016-03-20 ENCOUNTER — Telehealth: Payer: Self-pay

## 2016-03-20 NOTE — Telephone Encounter (Signed)
E 

## 2016-03-20 NOTE — Telephone Encounter (Signed)

## 2016-03-20 NOTE — Telephone Encounter (Signed)
Please [facility] no show and [facility]

## 2016-03-21 ENCOUNTER — Encounter: Payer: 59 | Admitting: Family Medicine

## 2016-03-22 ENCOUNTER — Encounter: Payer: Self-pay | Admitting: Family Medicine

## 2016-03-22 NOTE — Telephone Encounter (Signed)
Letter w/ fee sent

## 2016-10-28 ENCOUNTER — Ambulatory Visit (INDEPENDENT_AMBULATORY_CARE_PROVIDER_SITE_OTHER): Payer: 59 | Admitting: Family Medicine

## 2016-10-28 ENCOUNTER — Encounter: Payer: Self-pay | Admitting: Family Medicine

## 2016-10-28 VITALS — BP 130/80 | HR 70 | Temp 98.3°F | Resp 16 | Wt 347.8 lb

## 2016-10-28 DIAGNOSIS — I10 Essential (primary) hypertension: Secondary | ICD-10-CM

## 2016-10-28 DIAGNOSIS — R519 Headache, unspecified: Secondary | ICD-10-CM

## 2016-10-28 DIAGNOSIS — R51 Headache: Secondary | ICD-10-CM

## 2016-10-28 DIAGNOSIS — J302 Other seasonal allergic rhinitis: Secondary | ICD-10-CM | POA: Diagnosis not present

## 2016-10-28 NOTE — Patient Instructions (Signed)
Short-term use of Allegra-D or Claritin-D and also either Flonase or Rhinocort nasal spray. You can take 4 Advil 3 times per day. Pain

## 2016-10-28 NOTE — Progress Notes (Signed)
Subjective:    Patient ID: Parker Clay, male    DOB: 1970/07/30, 46 y.o.   MRN: [id number]  HPI HEENT [name] onset last night of a nasal pain as well as in the temporal area and behind the eyes. He has no sneezing, itchy watery eyes, PND, fever, chills, nausea, vomiting, blurred or double vision. He does note difficulty with springtime allergies. He does smoke cigars on occasion. He did try and Excedrin headache this morning without much success.   Review of Systems     Objective:   Physical Exam Alert and in no distress. Tympanic membranes and canals are normal. Pharyngeal area is normal. Neck is supple without adenopathy or thyromegaly. Cardiac exam shows a regular sinus rhythm without murmurs or gallops. Lungs are clear to auscultation. His mucosa is reddish purple. Tender over ethmoid sinuses.       Assessment & Plan:  Sinus headache  Seasonal allergic rhinitis, unspecified trigger  Essential hypertension I explained that his symptoms are probably allergy related headache. Recommend short-term use of Claritin-D or Allegra-D due to his hypertension as well as Flonase/Rhinocort. Also recommend 800 mg 3 times a day of Advil for the pain. He will keep me informed.

## 2016-12-20 ENCOUNTER — Other Ambulatory Visit: Payer: Self-pay | Admitting: Family Medicine

## 2016-12-20 NOTE — Telephone Encounter (Signed)
Left message for pt to call back and schedule an appt for his BP has he hasn't been seen since 2016 for his bp. Once appt is scheduled then we can refill his bp meds for 30 days only

## 2016-12-27 ENCOUNTER — Other Ambulatory Visit: Payer: Self-pay | Admitting: Family Medicine

## 2016-12-30 ENCOUNTER — Telehealth: Payer: Self-pay

## 2016-12-30 ENCOUNTER — Other Ambulatory Visit: Payer: Self-pay | Admitting: Family Medicine

## 2016-12-30 ENCOUNTER — Ambulatory Visit (INDEPENDENT_AMBULATORY_CARE_PROVIDER_SITE_OTHER): Payer: 59 | Admitting: Family Medicine

## 2016-12-30 ENCOUNTER — Encounter: Payer: Self-pay | Admitting: Family Medicine

## 2016-12-30 VITALS — BP 162/98 | HR 58 | Temp 97.6°F | Resp 18 | Wt 348.2 lb

## 2016-12-30 DIAGNOSIS — G43809 Other migraine, not intractable, without status migrainosus: Secondary | ICD-10-CM

## 2016-12-30 DIAGNOSIS — R51 Headache: Secondary | ICD-10-CM | POA: Diagnosis not present

## 2016-12-30 DIAGNOSIS — I10 Essential (primary) hypertension: Secondary | ICD-10-CM

## 2016-12-30 DIAGNOSIS — R519 Headache, unspecified: Secondary | ICD-10-CM

## 2016-12-30 MED ORDER — ZOLMITRIPTAN 5 MG NA SOLN: 1.0000 | NASAL | 0 refills | Status: DC | PRN

## 2016-12-30 NOTE — Telephone Encounter (Signed)
PA in progress- [employer] [phone]- Ms. Lequita HaltMorgan ref # [id number] MRI brain without. PA marked as urgent and awaiting clinical review.

## 2016-12-30 NOTE — Addendum Note (Signed)
Addended by: Minette HeadlandBENFIELD, Lindalee Huizinga L on: 12/30/2016 03:57 PM   Modules accepted: Orders

## 2016-12-30 NOTE — Patient Instructions (Signed)
Take 4 ibuprofen 3 times per day.  Call me tomorrow and let me know how you are doing you may take 1 more Zomig later today if you need to

## 2016-12-30 NOTE — Progress Notes (Addendum)
Subjective:    Patient ID: Parker Clay, male    DOB: 18-Aug-1970, 46 y.o.   MRN: [id number]  HPI He is here for evaluation of a headache that started last night.  He was in the bitemporal area and behind the eyes, sharp and throbbing.  Light and sound did  bother him but he had no nausea.  No blurred or double vision.  Does have a previous history of allergies but presently no difficulty with sneezing, itchy watery eyes, nasal congestion.  He states that he had a similar episode but less intense with his last visit.  That was diagnosed with a sinus infection.  He also stated he had one prior history of a similar headache but none other than that.  His sister does have a history of migraine.   Review of Systems     Objective:   Physical Exam Alert and in no distress.  EOMI.  Other cranial nerves grossly intact.  Fundi are benign.  DTRs normal .tyympanic membranes and canals are normal. Pharyngeal area is normal. Neck is supple without adenopathy or thyromegaly. Cardiac exam shows a regular sinus rhythm without murmurs or gallops. Lungs are clear to auscultation.        Assessment & Plan:  Migraine variant with headache - Plan: zolmitriptan (ZOMIG) 5 MG nasal solution  Acute nonintractable headache, unspecified headache type - Plan: MR Brain Wo Contrast  Essential hypertension He was given Zomig here in the office.  I will call it in for him.  He is to call me tomorrow.  Also recommend 800 mg 3 times daily of ibuprofen.  Since this essentially is a new onset migraine, I think an MRI is appropriate. The blood pressure was slightly elevated today on we will follow-up on this within the next month or so.

## 2016-12-31 NOTE — Telephone Encounter (Signed)
[employer] requested copy of note for review. [facility]. Parker Clay/RLB

## 2017-01-01 NOTE — Telephone Encounter (Signed)
MRI approved- submitted to [facility] to schedule. Trixie Rude/RLB

## 2017-01-07 ENCOUNTER — Other Ambulatory Visit: Payer: No Typology Code available for payment source

## 2017-02-12 ENCOUNTER — Ambulatory Visit (INDEPENDENT_AMBULATORY_CARE_PROVIDER_SITE_OTHER): Payer: 59 | Admitting: Family Medicine

## 2017-02-12 ENCOUNTER — Telehealth: Payer: Self-pay

## 2017-02-12 ENCOUNTER — Encounter: Payer: Self-pay | Admitting: Family Medicine

## 2017-02-12 VITALS — BP 148/98 | HR 77 | Ht 71.0 in | Wt 353.0 lb

## 2017-02-12 DIAGNOSIS — Z800 Family history of malignant neoplasm of digestive organs: Secondary | ICD-10-CM | POA: Insufficient documentation

## 2017-02-12 DIAGNOSIS — Z23 Encounter for immunization: Secondary | ICD-10-CM | POA: Diagnosis not present

## 2017-02-12 DIAGNOSIS — G43809 Other migraine, not intractable, without status migrainosus: Secondary | ICD-10-CM | POA: Insufficient documentation

## 2017-02-12 DIAGNOSIS — H02402 Unspecified ptosis of left eyelid: Secondary | ICD-10-CM | POA: Diagnosis not present

## 2017-02-12 DIAGNOSIS — I10 Essential (primary) hypertension: Secondary | ICD-10-CM

## 2017-02-12 DIAGNOSIS — Z0000 Encounter for general adult medical examination without abnormal findings: Secondary | ICD-10-CM

## 2017-02-12 LAB — CBC WITH DIFFERENTIAL/PLATELET
BASOS PCT: 0.2 %
Basophils Absolute: 9 cells/uL (ref 0–200)
EOS ABS: 129 {cells}/uL (ref 15–500)
Eosinophils Relative: 2.8 %
HCT: 41.7 % (ref 38.5–50.0)
Hemoglobin: 13.8 g/dL (ref 13.2–17.1)
Lymphs Abs: 2024 cells/uL (ref 850–3900)
MCH: 28.5 pg (ref 27.0–33.0)
MCHC: 33.1 g/dL (ref 32.0–36.0)
MCV: 86 fL (ref 80.0–100.0)
MONOS PCT: 8.5 %
MPV: 11.6 fL (ref 7.5–12.5)
NEUTROS ABS: 2047 {cells}/uL (ref 1500–7800)
Neutrophils Relative %: 44.5 %
PLATELETS: 219 10*3/uL (ref 140–400)
RBC: 4.85 10*6/uL (ref 4.20–5.80)
RDW: 13.7 % (ref 11.0–15.0)
TOTAL LYMPHOCYTE: 44 %
WBC mixed population: 391 cells/uL (ref 200–950)
WBC: 4.6 10*3/uL (ref 3.8–10.8)

## 2017-02-12 LAB — COMPREHENSIVE METABOLIC PANEL
AG Ratio: 1.4 (calc) (ref 1.0–2.5)
ALT: 34 U/L (ref 9–46)
AST: 39 U/L (ref 10–40)
Albumin: 4 g/dL (ref 3.6–5.1)
Alkaline phosphatase (APISO): 67 U/L (ref 40–115)
BUN: 11 mg/dL (ref 7–25)
CO2: 29 mmol/L (ref 20–32)
CREATININE: 1.07 mg/dL (ref 0.60–1.35)
Calcium: 9.1 mg/dL (ref 8.6–10.3)
Chloride: 105 mmol/L (ref 98–110)
Globulin: 2.8 g/dL (calc) (ref 1.9–3.7)
Glucose, Bld: 98 mg/dL (ref 65–99)
Potassium: 4.3 mmol/L (ref 3.5–5.3)
SODIUM: 139 mmol/L (ref 135–146)
TOTAL PROTEIN: 6.8 g/dL (ref 6.1–8.1)
Total Bilirubin: 0.4 mg/dL (ref 0.2–1.2)

## 2017-02-12 LAB — LIPID PANEL
Cholesterol: 142 mg/dL (ref <200)
HDL: 34 mg/dL — AB (ref >40)
LDL CHOLESTEROL (CALC): 89 mg/dL
Non-HDL Cholesterol (Calc): 108 mg/dL (calc) (ref <130)
Total CHOL/HDL Ratio: 4.2 (calc) (ref <5.0)
Triglycerides: 98 mg/dL (ref <150)

## 2017-02-12 MED ORDER — LISINOPRIL-HYDROCHLOROTHIAZIDE 20-12.5 MG PO TABS: 1.0000 | ORAL_TABLET | Freq: Every day | ORAL | 3 refills | Status: DC

## 2017-02-12 MED ORDER — AMLODIPINE BESYLATE 10 MG PO TABS: 10.0000 mg | ORAL_TABLET | Freq: Every day | ORAL | 3 refills | Status: DC

## 2017-02-12 NOTE — Telephone Encounter (Signed)
Called [facility] regarding the referral, they were not open today to check with.  [facility] advised [facility] could see [facility] but it would be at the end of January?   Would this be okay to go ahead and schedule with them?   Thanks!

## 2017-02-12 NOTE — Telephone Encounter (Signed)
Called patient and advised that [facility] @ 9:30. Faxed over last OV note to them 02/12/17. Patient had no questions at this time.

## 2017-02-12 NOTE — Patient Instructions (Signed)
At least 1/2-hour something physical daily.  Add to that walking at work during your breaks

## 2017-02-12 NOTE — Telephone Encounter (Signed)
This is not urgent.

## 2017-02-12 NOTE — Telephone Encounter (Signed)
Ok

## 2017-02-12 NOTE — Progress Notes (Signed)
Subjective:    Patient ID: Parker Clay, male    DOB: 1971/02/07, 46 y.o.   MRN: [id number]  HPI He is here for complete examination.  He does complain of slight drooping of his left eyelid.  He states that he has a previous history of iritis and is wondering whether it is related to that.  He is scheduled for an MRI for follow-up on his headaches.  He does state that he is not had a headache recently.  Continues on his blood pressure medications and is doing well on them.  He does plan on getting involved in an exercise program.  He states that he is walking 2 miles on a fairly regular basis.  He has not set up to have anything done about his umbilical hernia.  He does complain of maceration between the toes and has been using antifungal medication.  Review of his family history indicates his father had colon cancer in his mid 63s.  Otherwise his family and social history as well as health maintenance and immunizations were reviewed.   Review of Systems  All other systems reviewed and are negative.      Objective:   Physical Exam BP (!) 148/98 (BP Location: Right Arm, Patient Position: Sitting)   Pulse 77   Ht 5\' 11"  (1.803 m)   Wt (!) 353 lb (160.1 kg)   SpO2 98%   BMI 49.23 kg/m   General Appearance:    Alert, cooperative, no distress, appears stated age  Head:    Normocephalic, without obvious abnormality, atraumatic  Eyes:    PERRL, conjunctiva/corneas clear, EOM's intact, fundi    benign.  Slight ptosis is noted of the left eye.  Ears:    Normal TM's and external ear canals  Nose:   Nares normal, mucosa normal, no drainage or sinus   tenderness  Throat:   Lips, mucosa, and tongue normal; teeth and gums normal  Neck:   Supple, no lymphadenopathy;  thyroid:  no   enlargement/tenderness/nodules; no carotid   bruit or JVD     Lungs:     Clear to auscultation bilaterally without wheezes, rales or     ronchi; respirations unlabored      Heart:    Regular rate and rhythm, S1  and S2 normal, no murmur, rub   or gallop     Abdomen:     Soft, non-tender, nondistended, normoactive bowel sounds,    no masses, no hepatosplenomegaly  Genitalia:   Deferred  Rectal:   Deferred  Extremities:   No clubbing, cyanosis or edema  Pulses:   2+ and symmetric all extremities  Skin:   Skin color, texture, turgor normal, maceration is noted between all of his toes.  No other evidence of tinea is noted.  Lymph nodes:   Cervical, supraclavicular, and axillary nodes normal  Neurologic:   CNII-XII intact, normal strength, sensation and gait; reflexes 2+ and symmetric throughout          Psych:   Normal mood, affect, hygiene and grooming.           Assessment & Plan:  Routine general medical examination at a health care facility - Plan: CBC with Differential/Platelet, Comprehensive metabolic panel, Lipid panel  Need for influenza vaccination - Plan: Flu Vaccine QUAD 6+ mos PF IM (Fluarix Quad PF)  Migraine variant with headache  Essential hypertension - Plan: CBC with Differential/Platelet, Comprehensive metabolic panel  Obesity, Class III, BMI 40-49.9 (morbid obesity) (HCC) - Plan: CBC  with Differential/Platelet, Comprehensive metabolic panel, Lipid panel  Ptosis of eyelid, left - Plan: Ambulatory referral to Ophthalmology  Family history of colon cancer in father - Plan: CBC with Differential/Platelet, Comprehensive metabolic panel  He is to go ahead and get the MRI and also discussed the umbilical hernia further.  I will send him to ophthalmology to have the ptosis evaluated further. Continue on his present medications. Also discussed diet and exercise with him and strongly encouraged him to get involved in a regular exercise program as well as cutting back on carbohydrates.  I discussed this with him in the past and reinforced it.  Recommend he set a goal waist size for under 40. Stool cards given for guaiac.

## 2017-02-12 NOTE — Telephone Encounter (Signed)
I called them back to schedule, and they would like to know if this is an urgent request, and if it was a new onset for the eyelid to be like that? Please advise, thank you!

## 2017-02-13 ENCOUNTER — Ambulatory Visit
Admission: RE | Admit: 2017-02-13 | Discharge: 2017-02-13 | Disposition: A | Discharge status: Home / Self Care | Payer: 59 | Source: Ambulatory Visit | Attending: Family Medicine | Admitting: Family Medicine

## 2017-02-13 DIAGNOSIS — R51 Headache: Principal | ICD-10-CM

## 2017-02-13 DIAGNOSIS — R519 Headache, unspecified: Secondary | ICD-10-CM

## 2017-05-20 ENCOUNTER — Telehealth: Payer: Self-pay | Admitting: Family Medicine

## 2017-05-20 NOTE — Telephone Encounter (Signed)
New Message  Pt verbalized he was referred to an MD for his hernia but I did not see anything besides ophthalmology.  Please f/u with pt

## 2017-05-20 NOTE — Telephone Encounter (Signed)
Spoke topt and he was referred to general surgery about 3 years ago. Pt was advised that he may need to call back to make an appt to have another referral. Thanks [facility]

## 2017-08-30 ENCOUNTER — Other Ambulatory Visit: Payer: Self-pay | Admitting: Family Medicine

## 2017-08-30 DIAGNOSIS — I10 Essential (primary) hypertension: Secondary | ICD-10-CM

## 2017-11-10 MED ORDER — OXYCODONE HCL 5 MG PO TABS
5.00 | ORAL_TABLET | ORAL | Status: DC
Start: ? — End: 2017-11-10

## 2017-11-10 MED ORDER — GENERIC EXTERNAL MEDICATION
4.00 | Status: DC
Start: ? — End: 2017-11-10

## 2017-11-10 MED ORDER — DOCUSATE SODIUM 100 MG PO CAPS
100.00 | ORAL_CAPSULE | ORAL | Status: DC
Start: 2017-11-09 — End: 2017-11-10

## 2017-11-10 MED ORDER — ACETAMINOPHEN 325 MG PO TABS
650.00 | ORAL_TABLET | ORAL | Status: DC
Start: ? — End: 2017-11-10

## 2017-11-10 MED ORDER — GENERIC EXTERNAL MEDICATION
650.00 | Status: DC
Start: ? — End: 2017-11-10

## 2017-11-10 MED ORDER — GENERIC EXTERNAL MEDICATION
0.08 | Status: DC
Start: ? — End: 2017-11-10

## 2017-11-10 MED ORDER — SODIUM CHLORIDE 0.9 % IV SOLN
10.00 | INTRAVENOUS | Status: DC
Start: ? — End: 2017-11-10

## 2017-11-10 MED ORDER — ENOXAPARIN SODIUM 40 MG/0.4ML ~~LOC~~ SOLN
40.00 | SUBCUTANEOUS | Status: DC
Start: 2017-11-08 — End: 2017-11-10

## 2017-11-10 MED ORDER — GENERIC EXTERNAL MEDICATION
Status: DC
Start: 2017-11-09 — End: 2017-11-10

## 2017-11-10 MED ORDER — GENERIC EXTERNAL MEDICATION
10.00 | Status: DC
Start: ? — End: 2017-11-10

## 2017-11-10 MED ORDER — HYDROMORPHONE HCL 1 MG/ML IJ SOLN
.50 | INTRAMUSCULAR | Status: DC
Start: ? — End: 2017-11-10

## 2017-11-10 MED ORDER — AMLODIPINE BESYLATE 10 MG PO TABS
10.00 | ORAL_TABLET | ORAL | Status: DC
Start: 2017-11-09 — End: 2017-11-10

## 2018-01-29 ENCOUNTER — Telehealth: Payer: Self-pay | Admitting: Family Medicine

## 2018-01-29 NOTE — Telephone Encounter (Signed)
Dismissal letter in guarantor snapshot  °

## 2018-03-16 ENCOUNTER — Ambulatory Visit (HOSPITAL_COMMUNITY)
Admission: EM | Admit: 2018-03-16 | Discharge: 2018-03-16 | Disposition: A | Discharge status: Home / Self Care | Payer: 59 | Attending: Family Medicine | Admitting: Family Medicine

## 2018-03-16 ENCOUNTER — Other Ambulatory Visit: Payer: Self-pay

## 2018-03-16 ENCOUNTER — Encounter (HOSPITAL_COMMUNITY): Payer: Self-pay | Admitting: Emergency Medicine

## 2018-03-16 DIAGNOSIS — R1314 Dysphagia, pharyngoesophageal phase: Secondary | ICD-10-CM | POA: Diagnosis not present

## 2018-03-16 NOTE — ED Triage Notes (Signed)
Pt states he felt a lump in his left neck last week.  Since that time he has been having a lot of difficulty swallowing anything, including liquids.

## 2018-03-16 NOTE — Discharge Instructions (Signed)
You will need to see an ear nose and throat specialist for this problem.

## 2018-03-16 NOTE — ED Provider Notes (Signed)
[facility]    CSN: [id number] Arrival date & time: 03/16/18  1127     History   Chief Complaint Chief Complaint  Patient presents with  . trouble swalling    HPI Jacobey Burtnett Berger is a 48 y.o. male.   This is an initial visit at [facility] urgent care for this 48 year old man.  Pt states he felt a lump in his left neck last week.  Since that time he has been having a lot of difficulty swallowing anything, including liquids.     Past Medical History:  Diagnosis Date  . Hypertension   . Obesity     Patient Active Problem List   Diagnosis Date Noted  . Family history of colon cancer in father 02/12/2017  . Migraine variant with headache 02/12/2017  . Ptosis of eyelid, left 02/12/2017  . Testicular atrophy 05/03/2014  . Hypertension 04/02/2011  . Obesity, Class III, BMI 40-49.9 (morbid obesity) (HCC) 04/02/2011    History reviewed. No pertinent surgical history.     Home Medications    Prior to Admission medications   Medication Sig Start Date End Date Taking? Authorizing Provider  amLODipine (NORVASC) 10 MG tablet Take 1 tablet (10 mg total) by mouth daily. 02/12/17  Yes Ronnald Nian, MD  lisinopril-hydrochlorothiazide (PRINZIDE,ZESTORETIC) 20-12.5 MG tablet TAKE 1 TABLET BY MOUTH DAILY 09/01/17  Yes Ronnald Nian, MD  zolmitriptan (ZOMIG) 5 MG nasal solution Place 1 spray as needed into the nose for migraine. 12/30/16   Ronnald Nian, MD  olmesartan-hydrochlorothiazide (BENICAR HCT) 40-12.5 MG per tablet Take 1 tablet by mouth daily.    05/02/11  [provider]    Family History Family History  Problem Relation Age of Onset  . Cancer Mother 19       breast  . Cancer Father 35       prostate    Social History Social History   Tobacco Use  . Smoking status: Current Some Day Smoker    Types: Cigars  . Smokeless tobacco: Never Used  Substance Use Topics  . Alcohol use: Yes    Comment: occasionally  . Drug use: No     Allergies    Patient has no known allergies.   Review of Systems Review of Systems   Physical Exam Triage Vital Signs ED Triage Vitals [03/16/18 1155]  Enc Vitals Group     BP 136/82     Pulse Rate 83     Resp      Temp (!) 97.2 F (36.2 C)     Temp Source Temporal     SpO2 100 %     Weight      Height      Head Circumference      Peak Flow      Pain Score 7     Pain Loc      Pain Edu?      Excl. in GC?    No data found.  Updated Vital Signs BP 136/82 (BP Location: Right Arm)   Pulse 83   Temp (!) 97.2 F (36.2 C) (Temporal)   SpO2 100%    Physical Exam Vitals signs and nursing note reviewed.  Constitutional:      General: He is not in acute distress.    Appearance: Normal appearance. He is obese. He is not ill-appearing, toxic-appearing or diaphoretic.  HENT:     Head: Normocephalic.     Right Ear: Tympanic membrane and external ear normal.  Left Ear: Tympanic membrane and external ear normal.     Nose: Nose normal.     Mouth/Throat:     Mouth: Mucous membranes are moist.     Pharynx: Oropharynx is clear.  Eyes:     Conjunctiva/sclera: Conjunctivae normal.  Neck:     Musculoskeletal: Normal range of motion and neck supple.  Cardiovascular:     Rate and Rhythm: Normal rate.  Pulmonary:     Effort: Pulmonary effort is normal.     Breath sounds: Normal breath sounds.  Musculoskeletal: Normal range of motion.  Skin:    General: Skin is warm and dry.  Neurological:     General: No focal deficit present.     Mental Status: He is alert and oriented to person, place, and time.  Psychiatric:        Mood and Affect: Mood normal.        Thought Content: Thought content normal.      UC Treatments / Results  Labs (all labs ordered are listed, but only abnormal results are displayed) Labs Reviewed - No data to display  EKG None  Radiology No results found.  Procedures Procedures (including critical care time)  Medications Ordered in UC Medications -  No data to display  Initial Impression / Assessment and Plan / UC Course  I have reviewed the triage vital signs and the nursing notes.  Pertinent labs & imaging results that were available during my care of the patient were reviewed by me and considered in my medical decision making (see chart for details).    Final Clinical Impressions(s) / UC Diagnoses   Final diagnoses:  Dysphagia, pharyngoesophageal phase     Discharge Instructions     You will need to see an ear nose and throat specialist for this problem.    ED Prescriptions    None     Controlled Substance Prescriptions [place] Controlled Substance Registry consulted? Not Applicable   Elvina SidleLauenstein, Kaniel Kiang, MD 03/16/18 1219

## 2018-03-17 ENCOUNTER — Emergency Department (HOSPITAL_COMMUNITY): Payer: 59

## 2018-03-17 ENCOUNTER — Other Ambulatory Visit: Payer: Self-pay | Admitting: Family Medicine

## 2018-03-17 ENCOUNTER — Emergency Department (HOSPITAL_COMMUNITY)
Admission: EM | Admit: 2018-03-17 | Discharge: 2018-03-17 | Disposition: A | Discharge status: Home / Self Care | Payer: 59 | Attending: Emergency Medicine | Admitting: Emergency Medicine

## 2018-03-17 ENCOUNTER — Other Ambulatory Visit: Payer: Self-pay

## 2018-03-17 ENCOUNTER — Encounter (HOSPITAL_COMMUNITY): Payer: Self-pay | Admitting: Obstetrics and Gynecology

## 2018-03-17 DIAGNOSIS — Z79899 Other long term (current) drug therapy: Secondary | ICD-10-CM | POA: Insufficient documentation

## 2018-03-17 DIAGNOSIS — I10 Essential (primary) hypertension: Secondary | ICD-10-CM

## 2018-03-17 DIAGNOSIS — R1310 Dysphagia, unspecified: Secondary | ICD-10-CM | POA: Diagnosis not present

## 2018-03-17 DIAGNOSIS — F17290 Nicotine dependence, other tobacco product, uncomplicated: Secondary | ICD-10-CM | POA: Diagnosis not present

## 2018-03-17 LAB — BASIC METABOLIC PANEL
Anion gap: 9 (ref 5–15)
BUN: 16 mg/dL (ref 6–20)
CALCIUM: 9.5 mg/dL (ref 8.9–10.3)
CO2: 24 mmol/L (ref 22–32)
Chloride: 105 mmol/L (ref 98–111)
Creatinine, Ser: 1.42 mg/dL — ABNORMAL HIGH (ref 0.61–1.24)
GFR calc Af Amer: >60 mL/min (ref >60)
GFR calc non Af Amer: 58 mL/min — ABNORMAL LOW (ref >60)
Glucose, Bld: 100 mg/dL — ABNORMAL HIGH (ref 70–99)
Potassium: 3.3 mmol/L — ABNORMAL LOW (ref 3.5–5.1)
Sodium: 138 mmol/L (ref 135–145)

## 2018-03-17 LAB — CBC WITH DIFFERENTIAL/PLATELET
Abs Immature Granulocytes: 0.01 10*3/uL (ref 0.00–0.07)
BASOS PCT: 0 %
Basophils Absolute: 0 10*3/uL (ref 0.0–0.1)
Eosinophils Absolute: 0.1 10*3/uL (ref 0.0–0.5)
Eosinophils Relative: 1 %
HCT: 45.9 % (ref 39.0–52.0)
Hemoglobin: 14.6 g/dL (ref 13.0–17.0)
IMMATURE GRANULOCYTES: 0 %
Lymphocytes Relative: 37 %
Lymphs Abs: 2.1 10*3/uL (ref 0.7–4.0)
MCH: 27.4 pg (ref 26.0–34.0)
MCHC: 31.8 g/dL (ref 30.0–36.0)
MCV: 86.3 fL (ref 80.0–100.0)
MONO ABS: 0.5 10*3/uL (ref 0.1–1.0)
Monocytes Relative: 9 %
NEUTROS PCT: 53 %
Neutro Abs: 3 10*3/uL (ref 1.7–7.7)
PLATELETS: 228 10*3/uL (ref 150–400)
RBC: 5.32 MIL/uL (ref 4.22–5.81)
RDW: 14.5 % (ref 11.5–15.5)
WBC: 5.8 10*3/uL (ref 4.0–10.5)
nRBC: 0 % (ref 0.0–0.2)

## 2018-03-17 MED ORDER — SODIUM CHLORIDE 0.9 % IV SOLN: INTRAVENOUS | Status: DC

## 2018-03-17 MED ORDER — SODIUM CHLORIDE (PF) 0.9 % IJ SOLN
INTRAMUSCULAR | Status: AC
  Filled 2018-03-17: qty 50

## 2018-03-17 MED ORDER — IOPAMIDOL (ISOVUE-300) INJECTION 61%
INTRAVENOUS | Status: AC
  Filled 2018-03-17: qty 100

## 2018-03-17 MED ORDER — IOHEXOL 300 MG/ML  SOLN
75.0000 mL | Freq: Once | INTRAMUSCULAR | Status: AC | PRN
  Administered 2018-03-17: 75 mL via INTRAVENOUS

## 2018-03-17 MED ORDER — AMLODIPINE BESYLATE 10 MG PO TABS: 10.0000 mg | ORAL_TABLET | Freq: Every day | ORAL | 3 refills | Status: DC

## 2018-03-17 MED ORDER — LISINOPRIL 10 MG PO TABS: 10.0000 mg | ORAL_TABLET | Freq: Every day | ORAL | 0 refills | Status: DC

## 2018-03-17 NOTE — ED Provider Notes (Signed)
[facility]EM[facility]ENCY [facility]PT Provider Note   CSN: [id number] Arrival date & time: 03/17/18  1328     History   Chief Complaint Chief Complaint  Patient presents with  . Dysphagia    HPI Parker Clay is a 48 y.o. male.  48 year old male presents with 1 week history of dysphagia.  Patient states that he has had trouble swallowing his secretions.  Denies any chance of this being a food impaction.  Seen in urgent care and was given referral to ENT which she is possibly tomorrow.  Denies any dyspnea.  States he does have emesis after he swallows been the food has been in his esophagus for some time.  No prior history of esophageal problems.  No treatment used prior to arrival.     Past Medical History:  Diagnosis Date  . Hypertension   . Obesity     Patient Active Problem List   Diagnosis Date Noted  . Family history of colon cancer in father 02/12/2017  . Migraine variant with headache 02/12/2017  . Ptosis of eyelid, left 02/12/2017  . Testicular atrophy 05/03/2014  . Hypertension 04/02/2011  . Obesity, Class III, BMI 40-49.9 (morbid obesity) (HCC) 04/02/2011    History reviewed. No pertinent surgical history.      Home Medications    Prior to Admission medications   Medication Sig Start Date End Date Taking? Authorizing Provider  amLODipine (NORVASC) 10 MG tablet Take 1 tablet (10 mg total) by mouth daily. 02/12/17  Yes Ronnald Nian, MD  lisinopril-hydrochlorothiazide (PRINZIDE,ZESTORETIC) 20-12.5 MG tablet TAKE 1 TABLET BY MOUTH DAILY 09/01/17  Yes Ronnald Nian, MD  olmesartan-hydrochlorothiazide (BENICAR HCT) 40-12.5 MG per tablet Take 1 tablet by mouth daily.    05/02/11  [provider]    Family History Family History  Problem Relation Age of Onset  . Cancer Mother 22       breast  . Cancer Father 79       prostate    Social History Social History   Tobacco Use  . Smoking status: Current Some Day Smoker    Types:  Cigars  . Smokeless tobacco: Never Used  Substance Use Topics  . Alcohol use: Yes    Comment: occasionally  . Drug use: No     Allergies   Patient has no known allergies.   Review of Systems Review of Systems  All other systems reviewed and are negative.    Physical Exam Updated Vital Signs BP (!) 157/107 (BP Location: Left Arm)   Pulse (!) 109   Temp 98.4 F (36.9 C) (Oral)   Resp 17   Ht 1.803 m (5\' 11" )   Wt (!) 152 kg   SpO2 100%   BMI 46.72 kg/m   Physical Exam Vitals signs and nursing note reviewed.  Constitutional:      General: He is not in acute distress.    Appearance: Normal appearance. He is well-developed. He is not toxic-appearing.  HENT:     Head: Normocephalic and atraumatic.  Eyes:     General: Lids are normal.     Conjunctiva/sclera: Conjunctivae normal.     Pupils: Pupils are equal, round, and reactive to light.  Neck:     Musculoskeletal: Normal range of motion and neck supple.     Thyroid: No thyroid mass.     Trachea: No tracheal deviation.  Cardiovascular:     Rate and Rhythm: Normal rate and regular rhythm.     Heart sounds:  Normal heart sounds. No murmur. No gallop.   Pulmonary:     Effort: Pulmonary effort is normal. No respiratory distress.     Breath sounds: Normal breath sounds. No stridor. No decreased breath sounds, wheezing, rhonchi or rales.  Abdominal:     General: Bowel sounds are normal. There is no distension.     Palpations: Abdomen is soft.     Tenderness: There is no abdominal tenderness. There is no rebound.  Musculoskeletal: Normal range of motion.        General: No tenderness.  Skin:    General: Skin is warm and dry.     Findings: No abrasion or rash.  Neurological:     Mental Status: He is alert and oriented to person, place, and time.     GCS: GCS eye subscore is 4. GCS verbal subscore is 5. GCS motor subscore is 6.     Cranial Nerves: No cranial nerve deficit.     Sensory: No sensory deficit.    Psychiatric:        Speech: Speech normal.        Behavior: Behavior normal.      ED Treatments / Results  Labs (all labs ordered are listed, but only abnormal results are displayed) Labs Reviewed  CBC WITH DIFFERENTIAL/PLATELET  BASIC METABOLIC PANEL    EKG None  Radiology No results found.  Procedures Procedures (including critical care time)  Medications Ordered in ED Medications  0.9 %  sodium chloride infusion (has no administration in time range)     Initial Impression / Assessment and Plan / ED Course  I have reviewed the triage vital signs and the nursing notes.  Pertinent labs & imaging results that were available during my care of the patient were reviewed by me and considered in my medical decision making (see chart for details).     CT of neck showed possible vocal cord palsy.  Patient has appointment with ENT tomorrow and will keep that.  Final Clinical Impressions(s) / ED Diagnoses   Final diagnoses:  None    ED Discharge Orders    None       Lorre Nick, MD 03/17/18 [phone]

## 2018-03-17 NOTE — Discharge Instructions (Addendum)
Keep your appointment with your ENT doctor tomorrow.  Your creatinine today was a little bit elevated at 1.42 and this will require follow-up by your doctor.

## 2018-03-17 NOTE — ED Notes (Signed)
Patient reports on Saturday afternoon, while driving back from the beach, he started gradually feel like his esophagus was tightening and his throat swelling. Patient reports he is struggling to get food, soups, or even hot tea down without having complications with swallowing. Patient's respirations are even, regular, and unlabored. Patient's skin is warm and dry. Patient was using his cell phone when entering the room.

## 2018-03-17 NOTE — ED Triage Notes (Signed)
Pt reports he has had a difficult time swallowing and cannot fully handle his own secretions. Pt reports he has been spitting out his spit, unable to eat, and is having more difficulty swallowing. Pt reports he has not been able to take his HTN medications, because he cannot swallow.

## 2018-03-17 NOTE — ED Notes (Signed)
Pt dx with hypertension - unable to take BP meds today

## 2018-04-01 ENCOUNTER — Other Ambulatory Visit (HOSPITAL_COMMUNITY): Payer: Self-pay | Admitting: Otolaryngology

## 2018-04-01 ENCOUNTER — Other Ambulatory Visit: Payer: Self-pay | Admitting: Otolaryngology

## 2018-04-01 DIAGNOSIS — R1314 Dysphagia, pharyngoesophageal phase: Secondary | ICD-10-CM

## 2018-04-13 ENCOUNTER — Ambulatory Visit (HOSPITAL_COMMUNITY)
Admission: RE | Admit: 2018-04-13 | Discharge: 2018-04-13 | Disposition: A | Discharge status: Home / Self Care | Payer: 59 | Source: Ambulatory Visit | Attending: Otolaryngology | Admitting: Otolaryngology

## 2018-04-13 DIAGNOSIS — R1314 Dysphagia, pharyngoesophageal phase: Secondary | ICD-10-CM | POA: Diagnosis present

## 2019-01-07 ENCOUNTER — Other Ambulatory Visit: Payer: Self-pay | Admitting: Family Medicine

## 2019-01-07 DIAGNOSIS — I10 Essential (primary) hypertension: Secondary | ICD-10-CM

## 2019-04-09 IMAGING — RF DG ESOPHAGUS
10 of 11 series · 17 of 24 positions shown · non-contrast
Comparison: None.

CLINICAL DATA: Dysphagia.  Recent esophageal stretching.

EXAM:
ESOPHOGRAM/BARIUM SWALLOW
TECHNIQUE: Combined double contrast and single contrast examination performed
using effervescent crystals, thick barium liquid, and thin barium
liquid.
FLUOROSCOPY TIME:  Fluoroscopy Time:  2 minutes
Radiation Exposure Index (if provided by the fluoroscopic device):
41.4
Number of Acquired Spot Images: 0

[Series 1: cp_standard · 0.52mm/px · 2 of 55 frames shown (1 of 10)]
[frame 9/55]
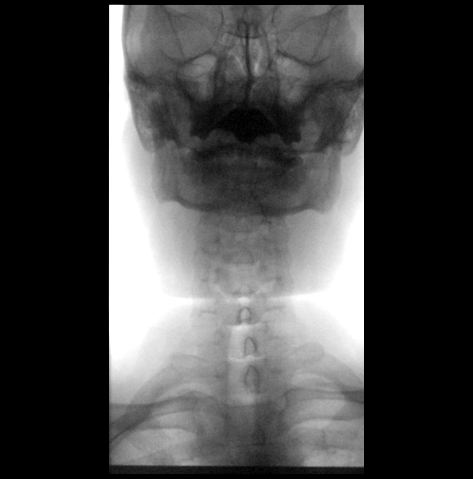
[frame 47/55]
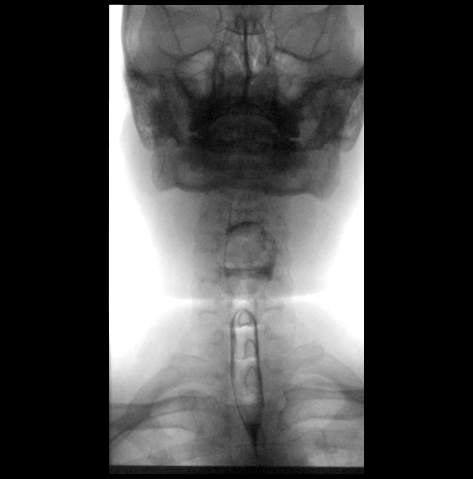

[Series 2: cp_standard · 0.52mm/px · 2 of 39 frames shown (2 of 10)]
[frame 20/39]
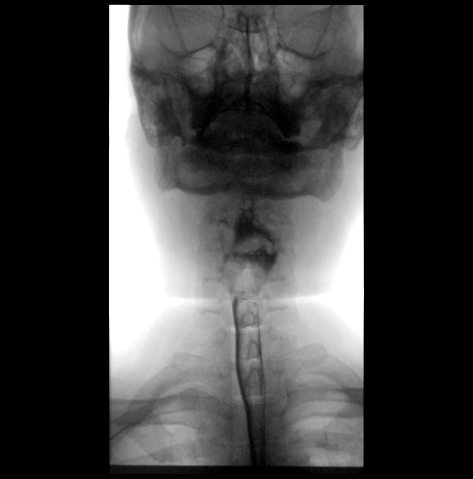
[frame 34/39]
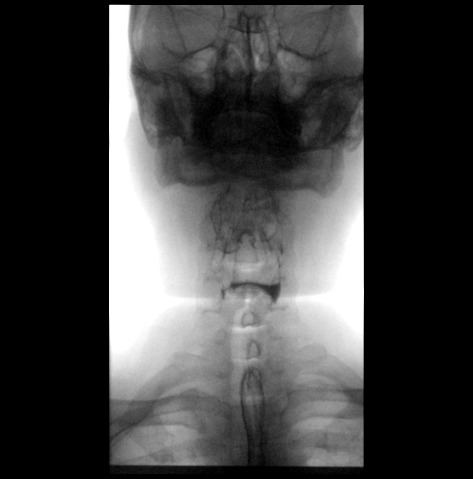

[Series 3: cp_standard · 0.51mm/px · 2 of 38 frames shown (3 of 10)]
[frame 20/38]
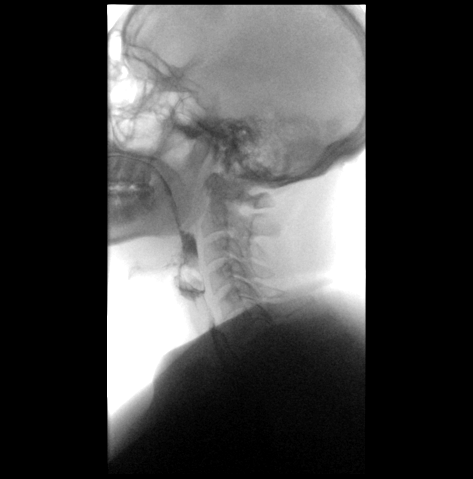
[frame 33/38]
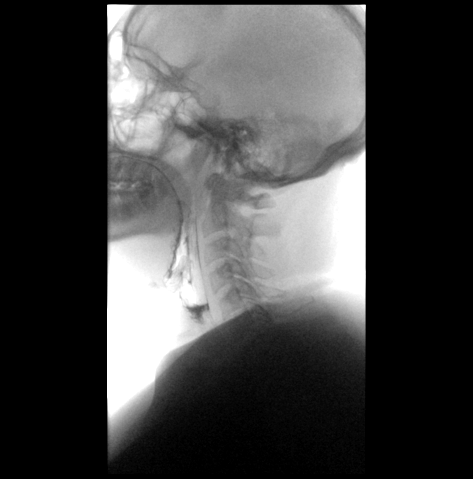

[Series 4: cp_standard · 0.52mm/px · 2 of 32 frames shown (4 of 10)]
[frame 28/32]
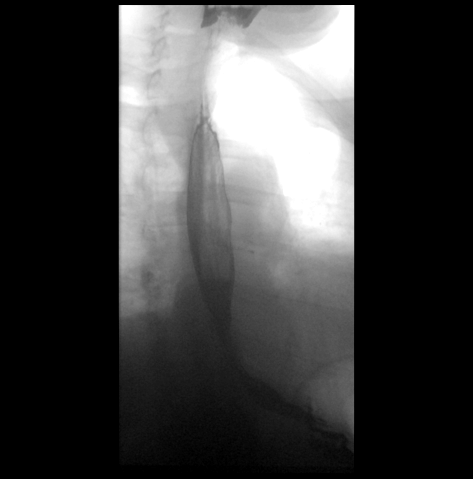
[frame 32/32]
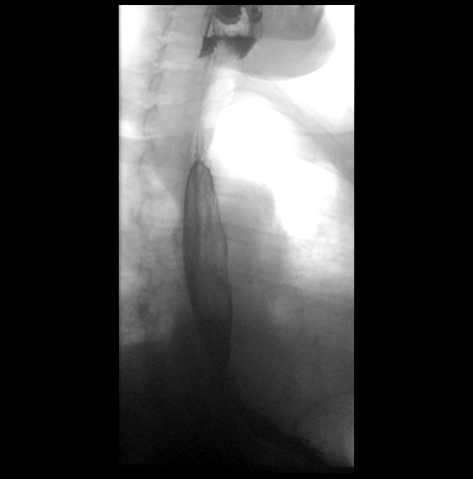

[Series 5: cp_standard · 0.52mm/px · 1 of 119 frames shown (5 of 10)]
[frame 60/119]
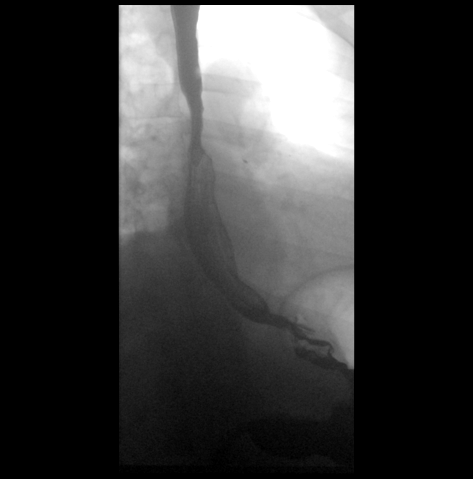

[Series 6: cp_standard · 0.26mm/px · 1 of 1 slices shown (6 of 10)]
[im 1/1]
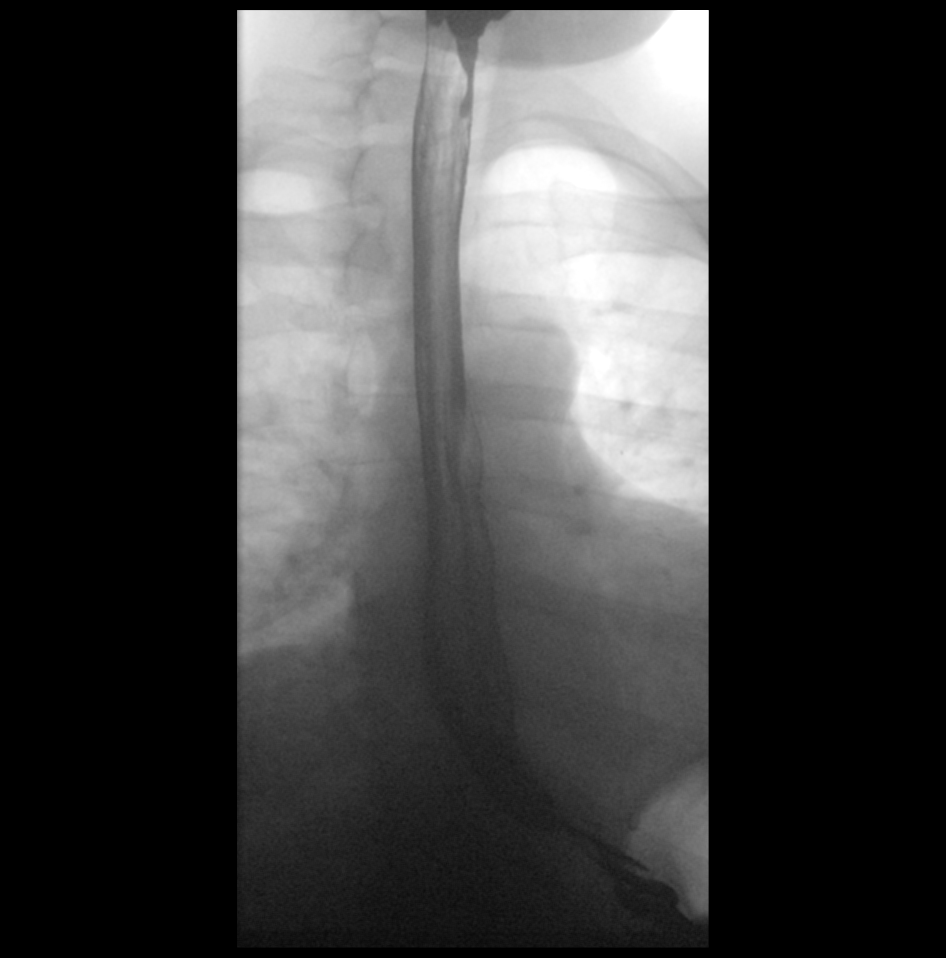

[Series 7: cp_standard · 0.26mm/px · 1 of 1 slices shown (7 of 10)]
[im 1/1]
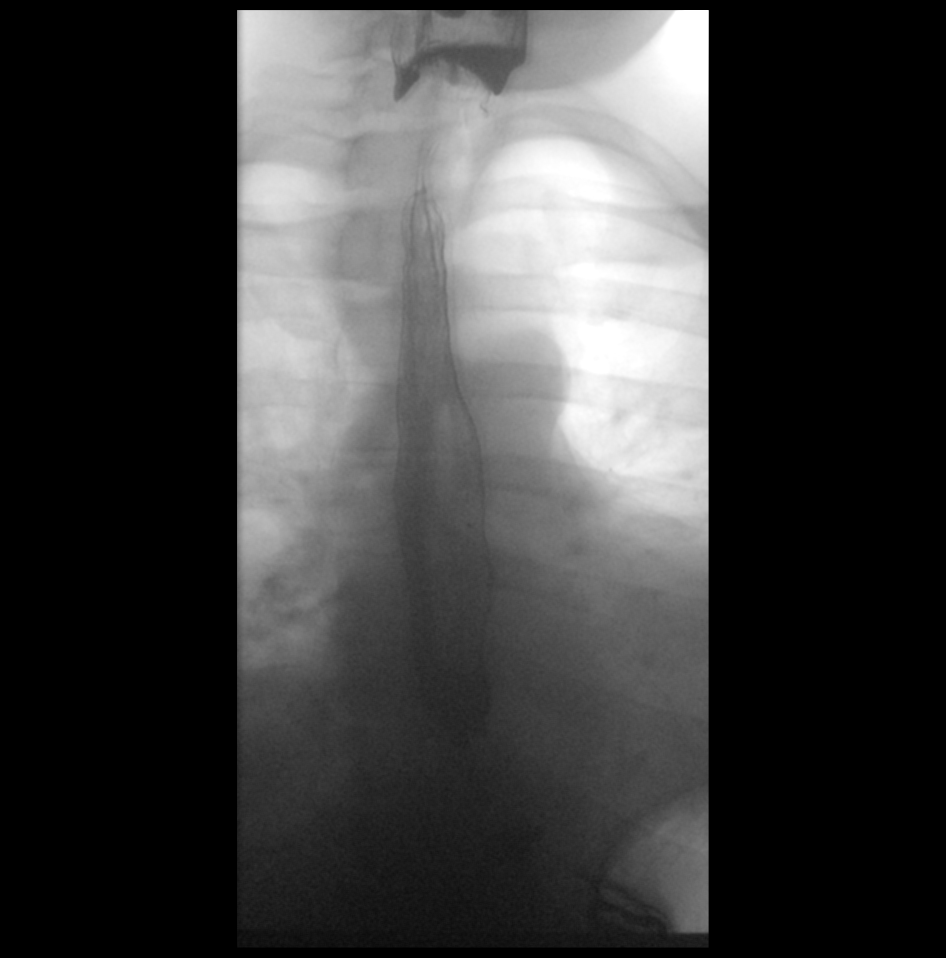

[Series 10: cp_standard · 0.53mm/px · 2 of 179 frames shown (8 of 10)]
[frame 27/179]
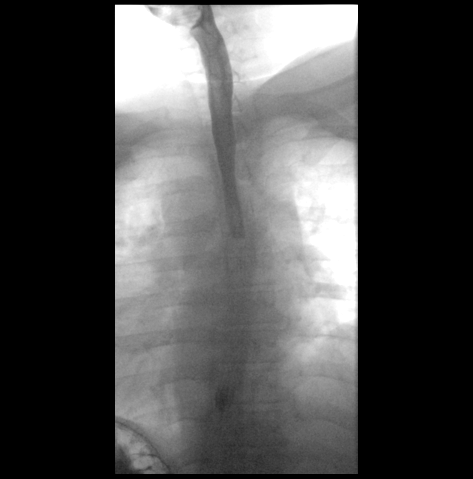
[frame 153/179]
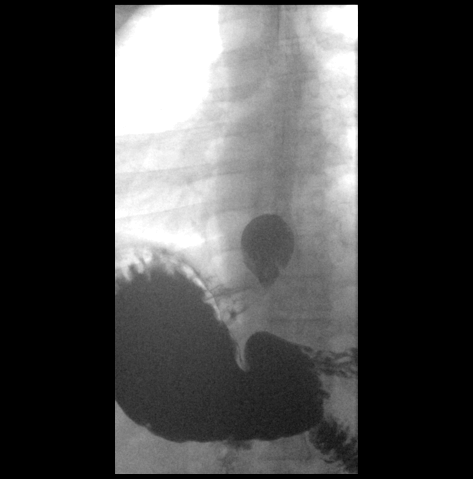

[Series 11: cp_standard · 0.53mm/px · 2 of 166 frames shown (9 of 10)]
[frame 84/166]
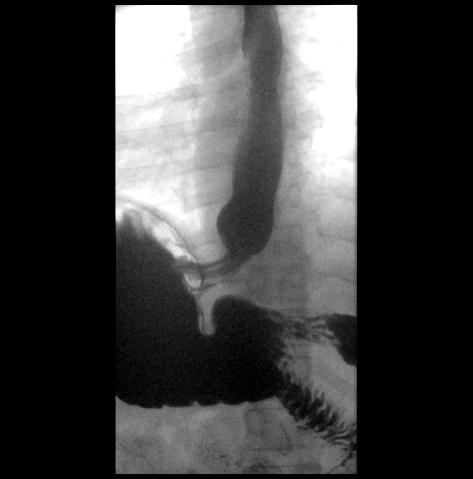
[frame 142/166]
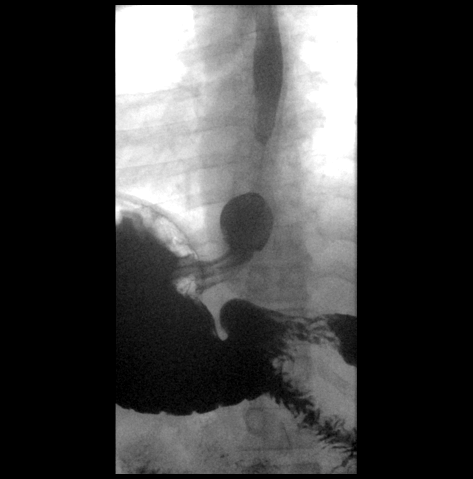

[Series 12: cp_standard · 0.54mm/px · 2 of 116 frames shown (10 of 10)]
[frame 10/116]
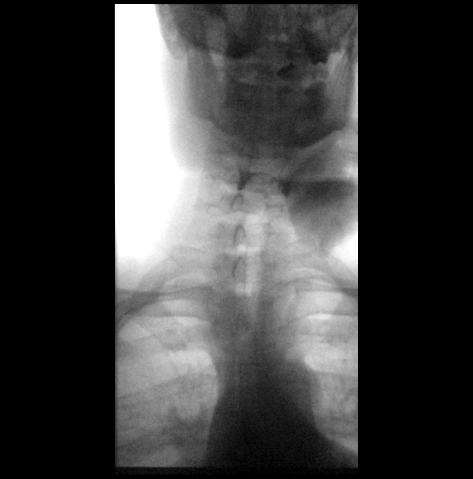
[frame 99/116]
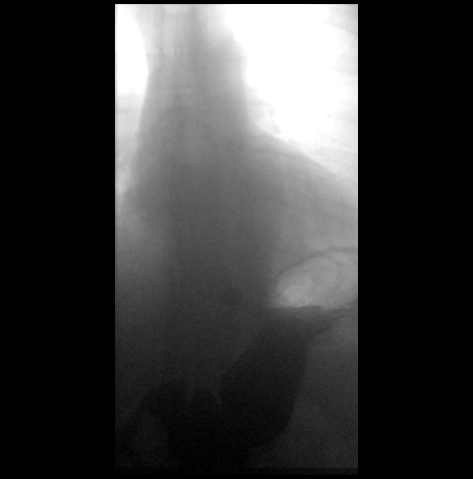

[17 of 24 positions shown; findings below may reference images not displayed]

FINDINGS: Single and double-contrast images of the pharynx and esophagus are
normal with no strictures, masses, or mucosal abnormalities. There
is a small hiatal hernia. The 13 mm barium tablet passed normally at
the end of the study.
IMPRESSION: No narrowing to explain the patient's dysphagia. The 13 mm barium
tablet passed normally.

## 2019-10-03 ENCOUNTER — Ambulatory Visit (HOSPITAL_COMMUNITY)
Admission: EM | Admit: 2019-10-03 | Discharge: 2019-10-03 | Disposition: A | Discharge status: Home / Self Care | Payer: Self-pay

## 2020-08-10 ENCOUNTER — Encounter (HOSPITAL_COMMUNITY): Payer: Self-pay

## 2020-08-10 ENCOUNTER — Ambulatory Visit (HOSPITAL_COMMUNITY)
Admission: EM | Admit: 2020-08-10 | Discharge: 2020-08-10 | Disposition: A | Discharge status: Home / Self Care | Payer: 59

## 2020-08-10 ENCOUNTER — Other Ambulatory Visit: Payer: Self-pay

## 2020-08-10 DIAGNOSIS — R059 Cough, unspecified: Secondary | ICD-10-CM

## 2020-08-10 DIAGNOSIS — M545 Low back pain, unspecified: Secondary | ICD-10-CM

## 2020-08-10 DIAGNOSIS — H1013 Acute atopic conjunctivitis, bilateral: Secondary | ICD-10-CM | POA: Diagnosis not present

## 2020-08-10 MED ORDER — PREDNISONE 10 MG (21) PO TBPK: ORAL_TABLET | ORAL | 0 refills | Status: DC

## 2020-08-10 MED ORDER — OLOPATADINE HCL 0.1 % OP SOLN: 1.0000 [drp] | Freq: Two times a day (BID) | OPHTHALMIC | 0 refills | Status: DC

## 2020-08-10 MED ORDER — TIZANIDINE HCL 4 MG PO CAPS: 4.0000 mg | ORAL_CAPSULE | Freq: Two times a day (BID) | ORAL | 0 refills | Status: DC | PRN

## 2020-08-10 NOTE — Discharge Instructions (Addendum)
I have called in steroids to help with your allergy/cough symptoms as well as back pain.  Please monitor blood pressure while taking this.  You should not take NSAIDs including aspirin, ibuprofen/Advil, naproxen/Aleve with this medication as it can cause stomach bleeding.  Use tizanidine up to twice a day as needed for pain.  This will make you sleepy do not drive or drink alcohol with it.  Use heat, rest, stretch for additional symptom relief.  I would use an over-the-counter antihistamine to help manage her symptoms.  If anything worsens please return for reevaluation.

## 2020-08-10 NOTE — ED Provider Notes (Signed)
[facility]    CSN: [id number] Arrival date & time: 08/10/20  1732      History   Chief Complaint Chief Complaint  Patient presents with   Cough   Back Pain    HPI [facility] is a 50 y.o. male.   Patient presents today with a 5-day history of lower back pain.  He reports pain is rated 7 on a 0-10 pain scale, localized to left lumbar back without radiation, described as aching/tightness, no aggravating relieving factors identified.  He has tried over-the-counter analgesics without pain relief.  He denies any urinary symptoms including dysuria, urinary frequency, urinary urgency.  He denies previous back injury or spinal surgery.  He denies any bowel/bladder incontinence, lower extremity weakness, saddle anesthesia.  He does report having URI symptoms including cough and finds the cough exacerbates pain.  He reports cough has improved and is now only a mild dry cough.  He initially had sore throat and congestion symptoms but these have since resolved.  He does continue to have bilateral eye irritation and redness.  He has not tried any over-the-counter medications for symptom management.  He does have a history of allergies but states is not typical timeframe that he gets allergy symptoms and denies any significant congestion.  He did take an at-home COVID test that was negative.  Reports he is up-to-date on influenza and COVID-19 vaccinations including booster.  Denies any recent antibiotic use.   Past Medical History:  Diagnosis Date   Hypertension    Obesity     Patient Active Problem List   Diagnosis Date Noted   Family history of colon cancer in father 02/12/2017   Migraine variant with headache 02/12/2017   Ptosis of eyelid, left 02/12/2017   Testicular atrophy 05/03/2014   Hypertension 04/02/2011   Obesity, Class III, BMI 40-49.9 (morbid obesity) (HCC) 04/02/2011    History reviewed. No pertinent surgical history.     Home Medications    Prior to  Admission medications   Medication Sig Start Date End Date Taking? Authorizing Provider  olopatadine (PATADAY) 0.1 % ophthalmic solution Place 1 drop into both eyes 2 (two) times daily. 08/10/20  Yes Mujahid Jalomo K, PA-C  pantoprazole (PROTONIX) 40 MG tablet Take 1 tablet by mouth daily. 04/01/18  Yes [provider]  predniSONE (STERAPRED UNI-PAK 21 TAB) 10 MG (21) TBPK tablet As directed 08/10/20  Yes Dewarren Ledbetter K, PA-C  tiZANidine (ZANAFLEX) 4 MG capsule Take 1 capsule (4 mg total) by mouth 2 (two) times daily as needed for muscle spasms. 08/10/20  Yes Katoria Yetman K, PA-C  amLODipine (NORVASC) 10 MG tablet Take 1 tablet (10 mg total) by mouth daily. 03/17/18   Lorre Nick, MD  lisinopril (PRINIVIL,ZESTRIL) 10 MG tablet Take 1 tablet (10 mg total) by mouth daily. 03/17/18   Lorre Nick, MD  lisinopril-hydrochlorothiazide (PRINZIDE,ZESTORETIC) 20-12.5 MG tablet TAKE 1 TABLET BY MOUTH DAILY 09/01/17   Ronnald Nian, MD  sildenafil (REVATIO) 20 MG tablet Take 20 mg by mouth daily as needed. 07/29/20   [provider]  olmesartan-hydrochlorothiazide (BENICAR HCT) 40-12.5 MG per tablet Take 1 tablet by mouth daily.    05/02/11  [provider]    Family History Family History  Problem Relation Age of Onset   Cancer Mother 60       breast   Cancer Father 74       prostate    Social History Social History   Tobacco Use  Smoking status: Some Days    Pack years: 0.00    Types: Cigars   Smokeless tobacco: Never  Substance Use Topics   Alcohol use: Yes    Comment: occasionally   Drug use: No     Allergies   Patient has no known allergies.   Review of Systems Review of Systems  Constitutional:  Positive for activity change. Negative for appetite change, fatigue and fever.  HENT:  Negative for congestion, sinus pressure, sneezing and sore throat.   Eyes:  Positive for redness and itching. Negative for pain and discharge.  Respiratory:  Positive for  cough. Negative for shortness of breath.   Cardiovascular:  Negative for chest pain.  Gastrointestinal:  Negative for abdominal pain, diarrhea, nausea and vomiting.  Genitourinary:  Negative for dysuria, frequency and urgency.  Musculoskeletal:  Positive for back pain. Negative for arthralgias and myalgias.  Neurological:  Negative for dizziness, light-headedness and headaches.    Physical Exam Triage Vital Signs ED Triage Vitals  Enc Vitals Group     BP 08/10/20 1850 (!) 142/86     Pulse Rate 08/10/20 1850 86     Resp 08/10/20 1850 18     Temp 08/10/20 1850 98.6 F (37 C)     Temp Source 08/10/20 1850 Oral     SpO2 08/10/20 1850 97 %     Weight --      Height --      Head Circumference --      Peak Flow --      Pain Score 08/10/20 1847 7     Pain Loc --      Pain Edu? --      Excl. in GC? --    No data found.  Updated Vital Signs BP (!) 142/86 (BP Location: Right Arm)   Pulse 86   Temp 98.6 F (37 C) (Oral)   Resp 18   SpO2 97%   Visual Acuity Right Eye Distance:   Left Eye Distance:   Bilateral Distance:    Right Eye Near:   Left Eye Near:    Bilateral Near:     Physical Exam Vitals reviewed.  Constitutional:      General: He is awake.     Appearance: Normal appearance. He is normal weight. He is not ill-appearing.     Comments: Very pleasant male appears stated age in no acute distress  HENT:     Head: Normocephalic and atraumatic.     Right Ear: Tympanic membrane, ear canal and external ear normal. Tympanic membrane is not erythematous or bulging.     Left Ear: Tympanic membrane, ear canal and external ear normal. Tympanic membrane is not erythematous or bulging.     Nose: Nose normal.     Right Sinus: No maxillary sinus tenderness or frontal sinus tenderness.     Left Sinus: No maxillary sinus tenderness or frontal sinus tenderness.     Mouth/Throat:     Pharynx: Uvula midline. No oropharyngeal exudate or posterior oropharyngeal erythema.      Comments: Drainage present posterior oropharynx Eyes:     Conjunctiva/sclera:     Right eye: Right conjunctiva is injected.     Left eye: Left conjunctiva is injected.  Cardiovascular:     Rate and Rhythm: Normal rate and regular rhythm.     Heart sounds: Normal heart sounds, S1 normal and S2 normal. No murmur heard. Pulmonary:     Effort: Pulmonary effort is normal. No accessory muscle usage or respiratory  distress.     Breath sounds: Normal breath sounds. No stridor. No wheezing, rhonchi or rales.     Comments: Clear to auscultation bilaterally Musculoskeletal:     Cervical back: Normal range of motion and neck supple. No tenderness or bony tenderness.     Thoracic back: No tenderness or bony tenderness.     Lumbar back: Tenderness present. No bony tenderness.     Comments: Back: No pain percussion of vertebrae.  Tenderness palpation of left lumbar paraspinal muscles.  No deformity or step-off noted.  Lymphadenopathy:     Head:     Right side of head: No submental, submandibular or tonsillar adenopathy.     Left side of head: No submental, submandibular or tonsillar adenopathy.     Cervical: No cervical adenopathy.  Neurological:     Mental Status: He is alert.  Psychiatric:        Behavior: Behavior is cooperative.     UC Treatments / Results  Labs (all labs ordered are listed, but only abnormal results are displayed) Labs Reviewed - No data to display  EKG   Radiology No results found.  Procedures Procedures (including critical care time)  Medications Ordered in UC Medications - No data to display  Initial Impression / Assessment and Plan / UC Course  I have reviewed the triage vital signs and the nursing notes.  Pertinent labs & imaging results that were available during my care of the patient were reviewed by me and considered in my medical decision making (see chart for details).      No indication for imaging given atraumatic injury and lack of bony  tenderness on exam without alarm symptoms.  Patient was prescribed prednisone taper with instruction not to take NSAIDs with this medication due to risk of GI bleeding.  He was prescribed Zanaflex to be taken up to twice a day as needed with instruction not to drive or drink alcohol with this medication.  Encouraged him to use heat, rest, stretch for additional symptom relief.  Discussed alarm symptoms that warrant emergent evaluation.  Strict return precautions given to which patient expressed understanding.  No indication for repeat COVID-19 or influenza testing given patient has been symptomatic for 5 days and this would not change course.  No evidence of acute infection that warrant initiation of antibiotics today.  He was encouraged use over-the-counter medications including antihistamine for symptom relief.  Discussed that prednisone taper should help manage congestion symptoms and improve cough.  He was prescribed Pataday to be used as needed for eye symptoms.  Discussed alarm symptoms that warrant emergent evaluation.  Strict return precautions given to which patient expressed understanding.  Final Clinical Impressions(s) / UC Diagnoses   Final diagnoses:  Acute left-sided low back pain without sciatica  Cough  Allergic conjunctivitis of both eyes     Discharge Instructions      I have called in steroids to help with your allergy/cough symptoms as well as back pain.  Please monitor blood pressure while taking this.  You should not take NSAIDs including aspirin, ibuprofen/Advil, naproxen/Aleve with this medication as it can cause stomach bleeding.  Use tizanidine up to twice a day as needed for pain.  This will make you sleepy do not drive or drink alcohol with it.  Use heat, rest, stretch for additional symptom relief.  I would use an over-the-counter antihistamine to help manage her symptoms.  If anything worsens please return for reevaluation.     ED Prescriptions  Medication Sig  Dispense Auth. Provider   predniSONE (STERAPRED UNI-PAK 21 TAB) 10 MG (21) TBPK tablet As directed 21 tablet Nichoals Heyde K, PA-C   tiZANidine (ZANAFLEX) 4 MG capsule Take 1 capsule (4 mg total) by mouth 2 (two) times daily as needed for muscle spasms. 20 capsule Daje Stark K, PA-C   olopatadine (PATADAY) 0.1 % ophthalmic solution Place 1 drop into both eyes 2 (two) times daily. 5 mL Cashton Hosley K, PA-C      PDMP not reviewed this encounter.   Jeani Hawking, PA-C 08/10/20 1919

## 2020-08-10 NOTE — ED Triage Notes (Signed)
Pt in with c/o lower left back pain x 3 days  Pt also c/o cough x 3 days  Pt has been taking motrin & cough drops for sx relief

## 2020-08-17 ENCOUNTER — Other Ambulatory Visit: Payer: Self-pay | Admitting: Internal Medicine

## 2020-08-17 ENCOUNTER — Ambulatory Visit
Admission: RE | Admit: 2020-08-17 | Discharge: 2020-08-17 | Disposition: A | Discharge status: Home / Self Care | Payer: 59 | Source: Ambulatory Visit | Attending: Internal Medicine | Admitting: Internal Medicine

## 2020-08-17 DIAGNOSIS — R109 Unspecified abdominal pain: Secondary | ICD-10-CM

## 2020-08-17 DIAGNOSIS — R319 Hematuria, unspecified: Secondary | ICD-10-CM

## 2021-06-29 ENCOUNTER — Other Ambulatory Visit: Payer: Self-pay

## 2021-06-29 DIAGNOSIS — W228XXA Striking against or struck by other objects, initial encounter: Secondary | ICD-10-CM | POA: Diagnosis not present

## 2021-06-29 DIAGNOSIS — Y990 Civilian activity done for income or pay: Secondary | ICD-10-CM | POA: Diagnosis not present

## 2021-06-29 DIAGNOSIS — Z5321 Procedure and treatment not carried out due to patient leaving prior to being seen by health care provider: Secondary | ICD-10-CM | POA: Diagnosis not present

## 2021-06-29 DIAGNOSIS — S3992XA Unspecified injury of lower back, initial encounter: Secondary | ICD-10-CM | POA: Insufficient documentation

## 2021-06-30 ENCOUNTER — Encounter (HOSPITAL_BASED_OUTPATIENT_CLINIC_OR_DEPARTMENT_OTHER): Payer: Self-pay

## 2021-06-30 ENCOUNTER — Emergency Department (HOSPITAL_BASED_OUTPATIENT_CLINIC_OR_DEPARTMENT_OTHER)
Admission: EM | Admit: 2021-06-30 | Discharge: 2021-06-30 | Disposition: A | Discharge status: Home / Self Care | Payer: 59 | Attending: Emergency Medicine | Admitting: Emergency Medicine

## 2021-06-30 NOTE — ED Triage Notes (Signed)
Pt states that he was at work last week and a wheel burrow tipped over and pulled a muscle in his lower back, pain continues and is unrelieved by OTC medications.  ?

## 2021-06-30 NOTE — ED Notes (Signed)
Pt LWBS due to wait times  ? ?

## 2021-08-13 IMAGING — CT CT ABD-PELV W/O CM
2 of 4 series · 17 of 46 positions shown, 19 images · non-contrast
Comparison: 05/26/2014

CLINICAL DATA: Flank pain, hematuria

EXAM:
CT ABDOMEN AND PELVIS WITHOUT CONTRAST
TECHNIQUE: Multidetector CT imaging of the abdomen and pelvis was performed
following the standard protocol without IV contrast.

[Series 2: routine abdomen pelvis without 5.00 br40 s3 axial · axial · non-contrast · 0.91mm/px · z∈[+1206,+1671]mm · 14 of 103 slices shown, 16 images]
[im 5/103  soft-tissue]
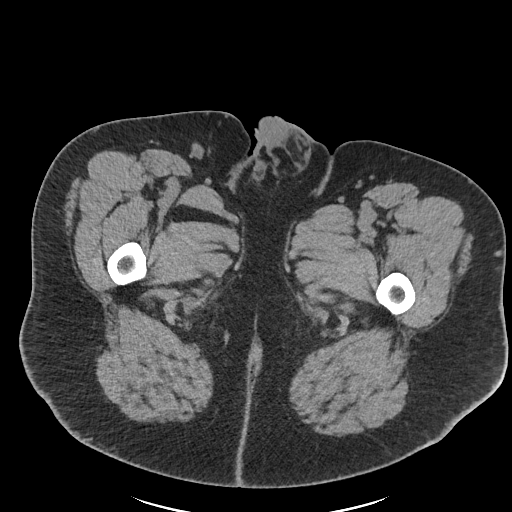
[im 5/103  bone]
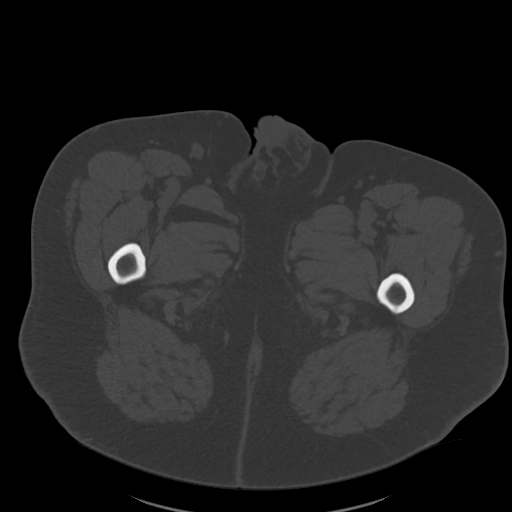
[im 13/103  soft-tissue]
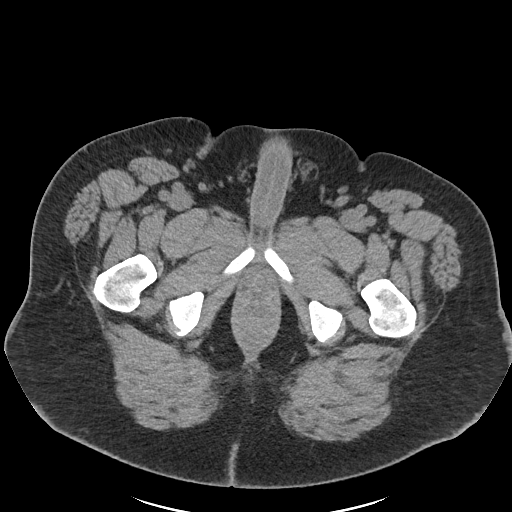
[im 22/103  soft-tissue]
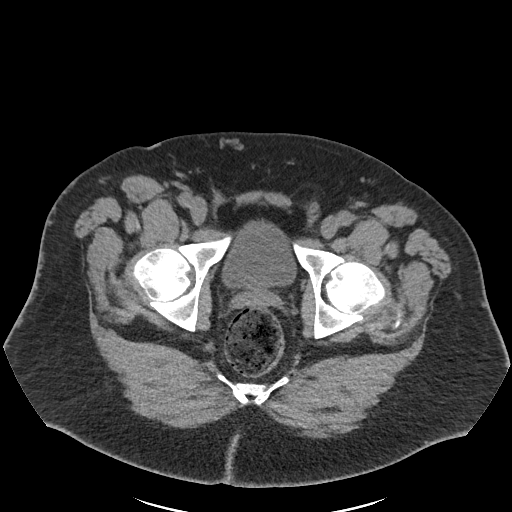
[im 26/103  soft-tissue]
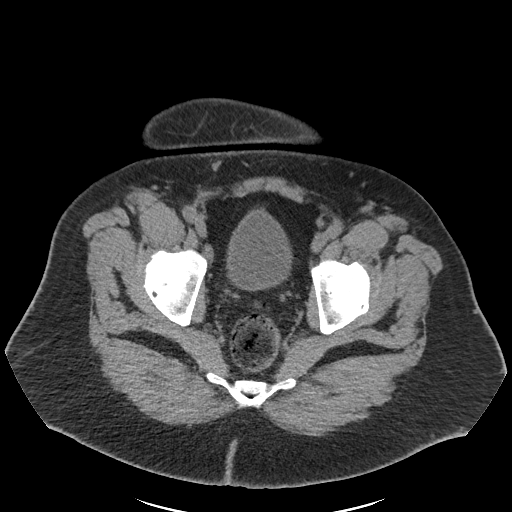
[im 35/103  soft-tissue]
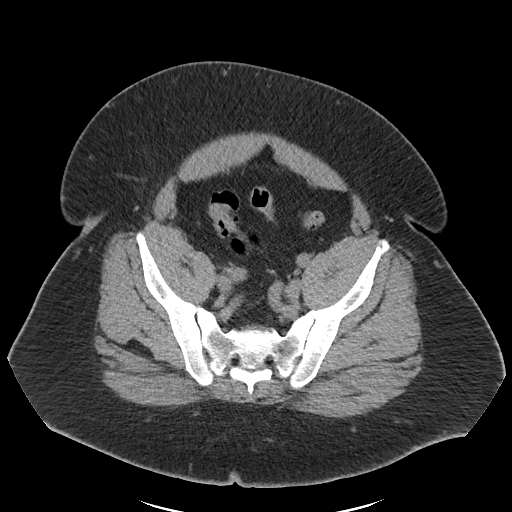
[im 43/103  soft-tissue]
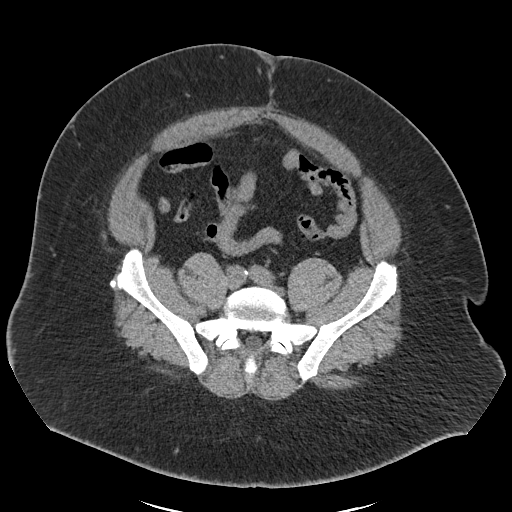
[im 47/103  soft-tissue]
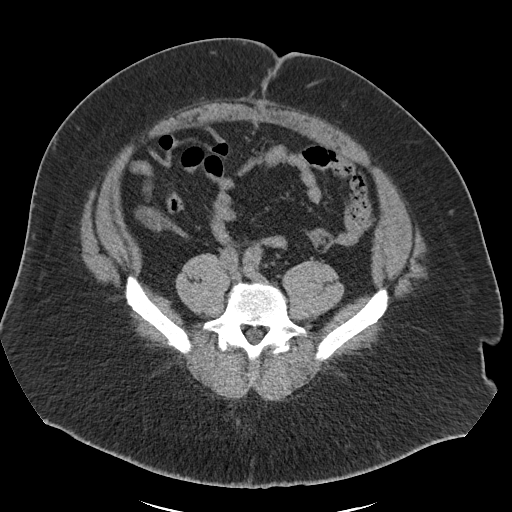
[im 56/103  soft-tissue]
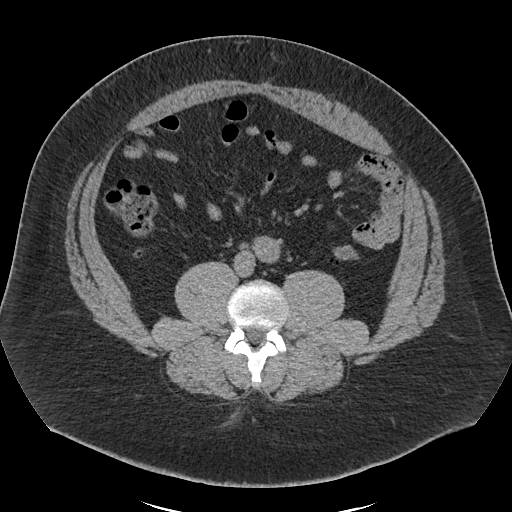
[im 60/103  soft-tissue]
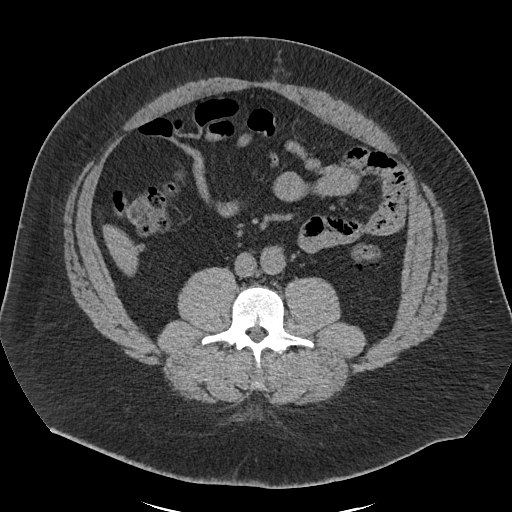
[im 60/103  bone]
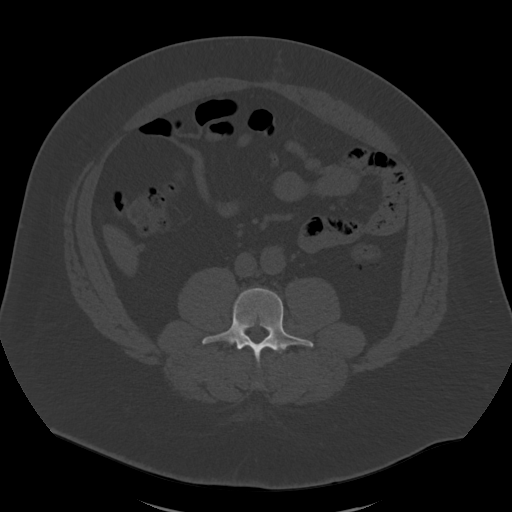
[im 69/103  soft-tissue]
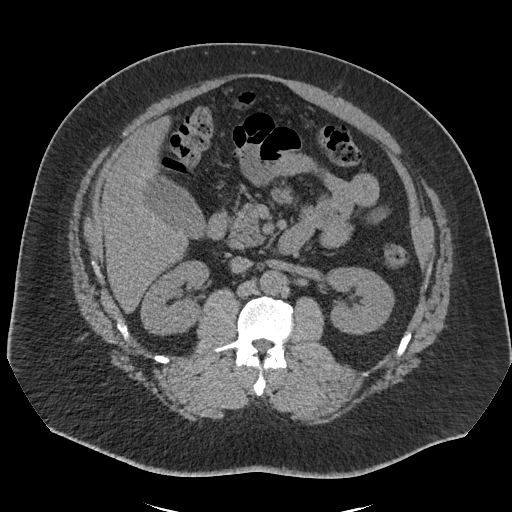
[im 77/103  soft-tissue]
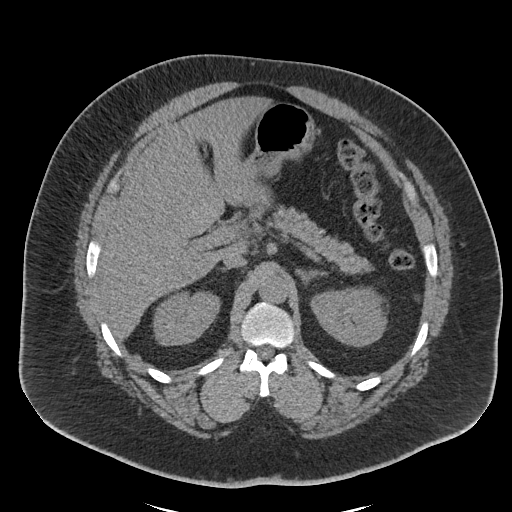
[im 81/103  soft-tissue]
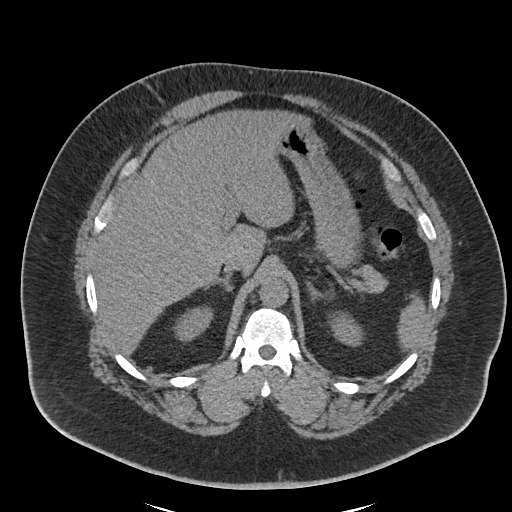
[im 90/103  soft-tissue]
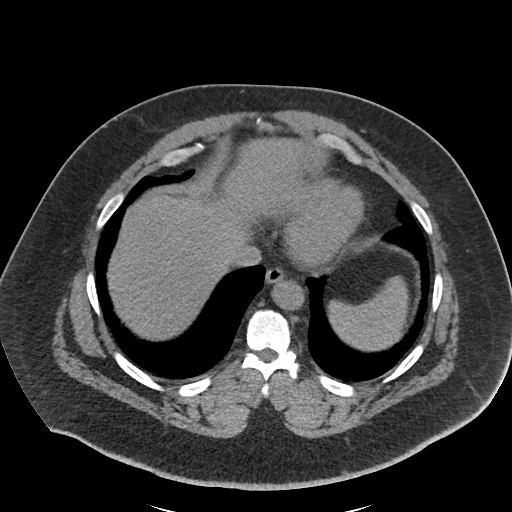
[im 98/103  soft-tissue]
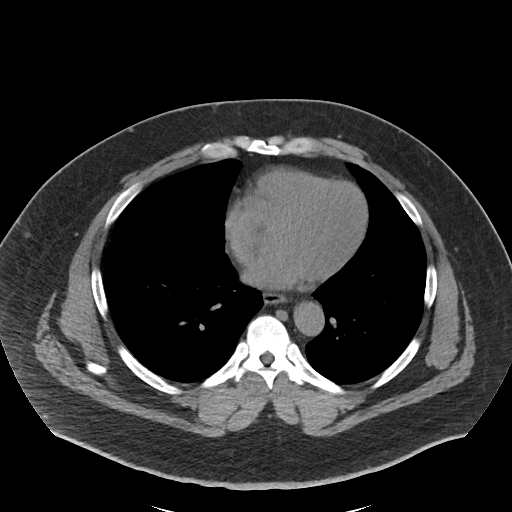

[Series 4: routine abdomen pelvis without 2.00 br40 s3 cor · coronal · non-contrast · 0.91mm/px · 3 of 234 slices shown]
[im 78/234  soft-tissue]
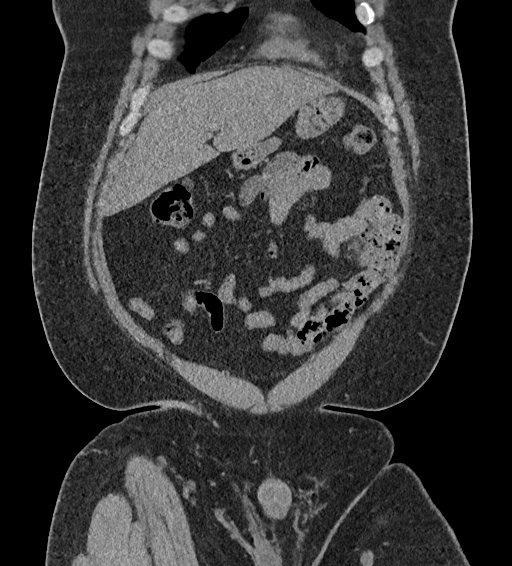
[im 104/234  soft-tissue]
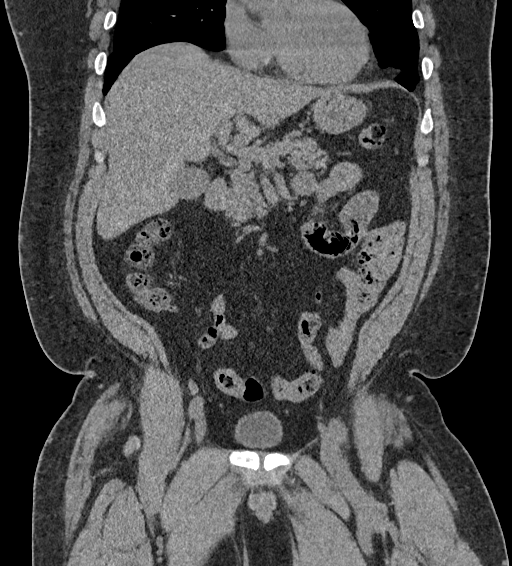
[im 130/234  soft-tissue]
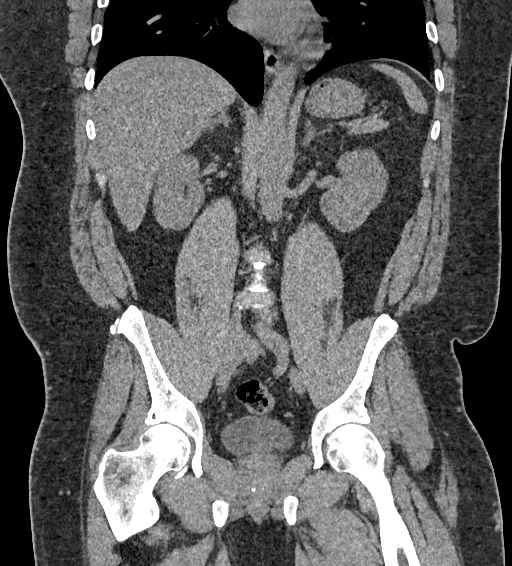

[17 of 46 positions shown; findings below may reference images not displayed]

FINDINGS: Lower chest: No acute abnormality.

Hepatobiliary: No solid liver abnormality is seen. No gallstones,
gallbladder wall thickening, or biliary dilatation.

Pancreas: Unremarkable. No pancreatic ductal dilatation or
surrounding inflammatory changes.

Spleen: Normal in size without significant abnormality.

Adrenals/Urinary Tract: Adrenal glands are unremarkable. Kidneys are
normal, without renal calculi, solid lesion, or hydronephrosis.
Bladder is unremarkable.

Stomach/Bowel: Stomach is within normal limits. Appendix appears
normal. No evidence of bowel wall thickening, distention, or
inflammatory changes.

Vascular/Lymphatic: No significant vascular findings are present. No
enlarged abdominal or pelvic lymph nodes.

Reproductive: No mass or other significant abnormality.

Other: Evidence of interval umbilical hernia repair. No
abdominopelvic ascites.

Musculoskeletal: No acute or significant osseous findings.
IMPRESSION: 1. No non-contrast CT findings of the abdomen or pelvis to explain
hematuria. No evidence of urinary tract calculus or hydronephrosis.
2. Evidence of interval umbilical hernia repair.

## 2022-03-15 ENCOUNTER — Encounter (HOSPITAL_COMMUNITY): Payer: Self-pay

## 2022-03-15 ENCOUNTER — Ambulatory Visit (HOSPITAL_COMMUNITY)
Admission: EM | Admit: 2022-03-15 | Discharge: 2022-03-15 | Disposition: A | Discharge status: Home / Self Care | Payer: 59 | Attending: Emergency Medicine | Admitting: Emergency Medicine

## 2022-03-15 DIAGNOSIS — R519 Headache, unspecified: Secondary | ICD-10-CM

## 2022-03-15 DIAGNOSIS — Z8719 Personal history of other diseases of the digestive system: Secondary | ICD-10-CM

## 2022-03-15 DIAGNOSIS — I10 Essential (primary) hypertension: Secondary | ICD-10-CM | POA: Diagnosis not present

## 2022-03-15 DIAGNOSIS — R5383 Other fatigue: Secondary | ICD-10-CM

## 2022-03-15 MED ORDER — LISINOPRIL-HYDROCHLOROTHIAZIDE 20-12.5 MG PO TABS: 1.0000 | ORAL_TABLET | Freq: Every day | ORAL | 0 refills | Status: DC

## 2022-03-15 MED ORDER — AMLODIPINE BESYLATE 10 MG PO TABS: 10.0000 mg | ORAL_TABLET | Freq: Every day | ORAL | 0 refills | Status: DC

## 2022-03-15 MED ORDER — PANTOPRAZOLE SODIUM 40 MG PO TBEC: 40.0000 mg | DELAYED_RELEASE_TABLET | Freq: Every day | ORAL | 0 refills | Status: AC

## 2022-03-15 NOTE — ED Triage Notes (Signed)
Chief Complaint: elevated BP and refill of medication. Patient having some headaches and fatigue.   Onset: 1 week   Prescriptions or OTC medications tried: No

## 2022-03-15 NOTE — ED Provider Notes (Signed)
[facility]    CSN: [id number] Arrival date & time: 03/15/22  1351      History   Chief Complaint Chief Complaint  Patient presents with   Medication Refill   Hypertension    HPI Parker Clay is a 52 y.o. male.  Patient presents complaining of hypertension that started 1 week ago.  Patient reports having a headache and fatigue that he states occurs when his blood pressure is high.  He denies any chest pain, shortness of breath, or any neurological symptoms.  He reports that he has not had his medicines since earlier this month, he states that his PCP was unable to refill them due to an outstanding bill.  He reports that he usually will see his PCP at least 1 time a year.   He is requesting a refill of his hypertension and GERD medicine.    Medication Refill Hypertension Associated symptoms include headaches. Pertinent negatives include no chest pain and no shortness of breath.    Past Medical History:  Diagnosis Date   Hypertension    Obesity     Patient Active Problem List   Diagnosis Date Noted   Family history of colon cancer in father 02/12/2017   Migraine variant with headache 02/12/2017   Ptosis of eyelid, left 02/12/2017   Testicular atrophy 05/03/2014   Hypertension 04/02/2011   Obesity, Class III, BMI 40-49.9 (morbid obesity) (HCC) 04/02/2011    History reviewed. No pertinent surgical history.     Home Medications    Prior to Admission medications   Medication Sig Start Date End Date Taking? Authorizing Provider  amLODipine (NORVASC) 10 MG tablet Take 1 tablet (10 mg total) by mouth daily. 03/15/22   Debby Freiberg, NP  lisinopril-hydrochlorothiazide (ZESTORETIC) 20-12.5 MG tablet Take 1 tablet by mouth daily. 03/15/22   Debby Freiberg, NP  olopatadine (PATADAY) 0.1 % ophthalmic solution Place 1 drop into both eyes 2 (two) times daily. 08/10/20   Raspet, Noberto Retort, PA-C  pantoprazole (PROTONIX) 40 MG tablet Take 1 tablet (40 mg total)  by mouth daily. 03/15/22   Debby Freiberg, NP  predniSONE (STERAPRED UNI-PAK 21 TAB) 10 MG (21) TBPK tablet As directed 08/10/20   Raspet, Erin K, PA-C  sildenafil (REVATIO) 20 MG tablet Take 20 mg by mouth daily as needed. 07/29/20   [provider]  tiZANidine (ZANAFLEX) 4 MG capsule Take 1 capsule (4 mg total) by mouth 2 (two) times daily as needed for muscle spasms. 08/10/20   Raspet, Noberto Retort, PA-C  olmesartan-hydrochlorothiazide (BENICAR HCT) 40-12.5 MG per tablet Take 1 tablet by mouth daily.    05/02/11  [provider]    Family History Family History  Problem Relation Age of Onset   Cancer Mother 77       breast   Cancer Father 84       prostate    Social History Social History   Tobacco Use   Smoking status: Some Days    Types: Cigars   Smokeless tobacco: Never  Substance Use Topics   Alcohol use: Yes    Comment: occasionally   Drug use: No     Allergies   Patient has no known allergies.   Review of Systems Review of Systems  Constitutional:  Positive for fatigue. Negative for activity change, appetite change, chills and fever.  Eyes:  Negative for visual disturbance.  Respiratory:  Negative for chest tightness and shortness of breath.   Cardiovascular:  Negative for chest  pain, palpitations and leg swelling.  Gastrointestinal: Negative.   Musculoskeletal:  Negative for gait problem.  Neurological:  Positive for headaches. Negative for dizziness, tremors, seizures, syncope, facial asymmetry, speech difficulty, weakness, light-headedness and numbness.  Psychiatric/Behavioral:  Negative for confusion and decreased concentration.      Physical Exam Triage Vital Signs ED Triage Vitals [03/15/22 1551]  Enc Vitals Group     BP (!) 191/121     Pulse Rate 67     Resp 16     Temp 97.8 F (36.6 C)     Temp Source Oral     SpO2 97 %     Weight (!) 354 lb 9.6 oz (160.8 kg)     Height 5' 10.5" (1.791 m)     Head Circumference      Peak Flow       Pain Score      Pain Loc      Pain Edu?      Excl. in [place]?    No data found.  Updated Vital Signs BP (!) 176/107 (BP Location: Right Arm)   Pulse 67   Temp 97.8 F (36.6 C) (Oral)   Resp 16   Ht 5' 10.5" (1.791 m)   Wt (!) 354 lb 9.6 oz (160.8 kg)   SpO2 97%   BMI 50.16 kg/m   Physical Exam Vitals and nursing note reviewed.  Constitutional:      Appearance: Normal appearance. He is not ill-appearing.  Cardiovascular:     Rate and Rhythm: Normal rate and regular rhythm.     Heart sounds: Normal heart sounds, S1 normal and S2 normal.  Pulmonary:     Effort: Pulmonary effort is normal.     Breath sounds: Normal breath sounds and air entry. No decreased breath sounds, wheezing, rhonchi or rales.  Neurological:     General: No focal deficit present.     Mental Status: He is alert.     GCS: GCS eye subscore is 4. GCS verbal subscore is 5. GCS motor subscore is 6.     Cranial Nerves: Cranial nerves 2-12 are intact.  Psychiatric:        Behavior: Behavior is cooperative.      UC Treatments / Results  Labs (all labs ordered are listed, but only abnormal results are displayed) Labs Reviewed - No data to display  EKG   Radiology No results found.  Procedures Procedures (including critical care time)  Medications Ordered in UC Medications - No data to display  Initial Impression / Assessment and Plan / UC Course  I have reviewed the triage vital signs and the nursing notes.  Pertinent labs & imaging results that were available during my care of the patient were reviewed by me and considered in my medical decision making (see chart for details).     Patient was evaluated for hypertension, headache, fatigue, and past history of acid reflux.  Pantoprazole 40 mg, amlodipine 10 mg, and lisinopril-hydrochlorothiazide 20-12.5 refill was sent to the pharmacy.  Patient was educated on the importance of monitoring his blood pressure at home once antihypertensive  medications is restarted.  Patient declined any basic blood work to be performed, he states that he will follow-up with his PCP once he clears his bill payment with them.  This Probation officer educated patient on the significance of obtaining basic lab work with the antihypertensive medication he is taking.  Patient was made aware of red flag symptoms that would warrant an emergency department visit.  Patient verbalized understanding of instructions.  Charting was provided using a a verbal dictation system, charting was proofread for errors, errors may occur which could change the meaning of the information charted.   Final Clinical Impressions(s) / UC Diagnoses   Final diagnoses:  Essential hypertension  Acute nonintractable headache, unspecified headache type  Other fatigue  History of gastroesophageal reflux (GERD)     Discharge Instructions      A refill of the amlodipine, lisinopril-hydrochlorothiazide, and pantoprazole has been sent to the pharmacy.  As discussed, it is very important that you schedule follow-up with your PCP for lab work.    If you develop any of these symptoms please go to the nearest emergency department: Inability to awaken the patient at time of expected wakening Severe or worsening headaches Somnolence or confusion Restlessness, unsteadiness, or seizures Difficulties with vision Vomiting, fever, or stiff neck Urinary or bowel incontinence Weakness or numbness involving any part of the body      ED Prescriptions     Medication Sig Dispense Auth. Provider   amLODipine (NORVASC) 10 MG tablet  (Status: Discontinued) Take 1 tablet (10 mg total) by mouth daily. 45 tablet Flossie Dibble, NP   lisinopril-hydrochlorothiazide (ZESTORETIC) 20-12.5 MG tablet Take 1 tablet by mouth daily. 45 tablet Flossie Dibble, NP   pantoprazole (PROTONIX) 40 MG tablet Take 1 tablet (40 mg total) by mouth daily. 45 tablet Flossie Dibble, NP   amLODipine (NORVASC) 10 MG  tablet Take 1 tablet (10 mg total) by mouth daily. 45 tablet Flossie Dibble, NP      PDMP not reviewed this encounter.   Flossie Dibble, NP 03/15/22 1843

## 2022-03-15 NOTE — Discharge Instructions (Addendum)
A refill of the amlodipine, lisinopril-hydrochlorothiazide, and pantoprazole has been sent to the pharmacy.  As discussed, it is very important that you schedule follow-up with your PCP for lab work.    If you develop any of these symptoms please go to the nearest emergency department: Inability to awaken the patient at time of expected wakening Severe or worsening headaches Somnolence or confusion Restlessness, unsteadiness, or seizures Difficulties with vision Vomiting, fever, or stiff neck Urinary or bowel incontinence Weakness or numbness involving any part of the body

## 2022-07-01 ENCOUNTER — Other Ambulatory Visit (HOSPITAL_BASED_OUTPATIENT_CLINIC_OR_DEPARTMENT_OTHER): Payer: Self-pay

## 2022-07-01 DIAGNOSIS — R5383 Other fatigue: Secondary | ICD-10-CM

## 2022-07-01 DIAGNOSIS — R0683 Snoring: Secondary | ICD-10-CM

## 2022-07-01 DIAGNOSIS — G4710 Hypersomnia, unspecified: Secondary | ICD-10-CM

## 2022-09-13 ENCOUNTER — Encounter (HOSPITAL_BASED_OUTPATIENT_CLINIC_OR_DEPARTMENT_OTHER): Payer: 59 | Admitting: Internal Medicine

## 2022-11-26 ENCOUNTER — Encounter (HOSPITAL_COMMUNITY)
Admission: EM | Disposition: A | Discharge status: Home Health | Payer: Self-pay | Source: Home / Self Care | Attending: Internal Medicine

## 2022-11-26 ENCOUNTER — Encounter (HOSPITAL_COMMUNITY): Payer: Self-pay | Admitting: Student

## 2022-11-26 ENCOUNTER — Other Ambulatory Visit: Payer: Self-pay

## 2022-11-26 ENCOUNTER — Emergency Department (HOSPITAL_COMMUNITY): Payer: 59

## 2022-11-26 ENCOUNTER — Inpatient Hospital Stay (HOSPITAL_COMMUNITY): Payer: 59

## 2022-11-26 ENCOUNTER — Inpatient Hospital Stay (HOSPITAL_COMMUNITY)
Admission: EM | Admit: 2022-11-26 | Discharge: 2022-11-29 | DRG: 717 | Disposition: A | Discharge status: Home Health | Payer: 59 | Attending: Internal Medicine | Admitting: Internal Medicine

## 2022-11-26 DIAGNOSIS — Z6841 Body Mass Index (BMI) 40.0 and over, adult: Secondary | ICD-10-CM

## 2022-11-26 DIAGNOSIS — L02215 Cutaneous abscess of perineum: Secondary | ICD-10-CM | POA: Diagnosis present

## 2022-11-26 DIAGNOSIS — K4090 Unilateral inguinal hernia, without obstruction or gangrene, not specified as recurrent: Secondary | ICD-10-CM | POA: Diagnosis present

## 2022-11-26 DIAGNOSIS — Z79899 Other long term (current) drug therapy: Secondary | ICD-10-CM

## 2022-11-26 DIAGNOSIS — E876 Hypokalemia: Secondary | ICD-10-CM | POA: Diagnosis present

## 2022-11-26 DIAGNOSIS — K760 Fatty (change of) liver, not elsewhere classified: Secondary | ICD-10-CM | POA: Diagnosis present

## 2022-11-26 DIAGNOSIS — E66813 Obesity, class 3: Secondary | ICD-10-CM | POA: Diagnosis present

## 2022-11-26 DIAGNOSIS — E8809 Other disorders of plasma-protein metabolism, not elsewhere classified: Secondary | ICD-10-CM | POA: Diagnosis present

## 2022-11-26 DIAGNOSIS — R7303 Prediabetes: Secondary | ICD-10-CM | POA: Diagnosis present

## 2022-11-26 DIAGNOSIS — I129 Hypertensive chronic kidney disease with stage 1 through stage 4 chronic kidney disease, or unspecified chronic kidney disease: Secondary | ICD-10-CM | POA: Diagnosis present

## 2022-11-26 DIAGNOSIS — N189 Chronic kidney disease, unspecified: Secondary | ICD-10-CM | POA: Diagnosis not present

## 2022-11-26 DIAGNOSIS — N179 Acute kidney failure, unspecified: Secondary | ICD-10-CM | POA: Diagnosis present

## 2022-11-26 DIAGNOSIS — Z8042 Family history of malignant neoplasm of prostate: Secondary | ICD-10-CM

## 2022-11-26 DIAGNOSIS — N493 Fournier gangrene: Principal | ICD-10-CM | POA: Diagnosis present

## 2022-11-26 DIAGNOSIS — F17290 Nicotine dependence, other tobacco product, uncomplicated: Secondary | ICD-10-CM | POA: Diagnosis present

## 2022-11-26 DIAGNOSIS — Z803 Family history of malignant neoplasm of breast: Secondary | ICD-10-CM

## 2022-11-26 DIAGNOSIS — N182 Chronic kidney disease, stage 2 (mild): Secondary | ICD-10-CM | POA: Diagnosis present

## 2022-11-26 DIAGNOSIS — I10 Essential (primary) hypertension: Secondary | ICD-10-CM

## 2022-11-26 HISTORY — PX: INCISION AND DRAINAGE PERIRECTAL ABSCESS: SHX1804

## 2022-11-26 LAB — CBC WITH DIFFERENTIAL/PLATELET
Abs Immature Granulocytes: 0 10*3/uL (ref 0.00–0.07)
Basophils Absolute: 0 10*3/uL (ref 0.0–0.1)
Basophils Relative: 0 %
Eosinophils Absolute: 0.2 10*3/uL (ref 0.0–0.5)
Eosinophils Relative: 1 %
HCT: 39.8 % (ref 39.0–52.0)
Hemoglobin: 13 g/dL (ref 13.0–17.0)
Lymphocytes Relative: 4 %
Lymphs Abs: 0.9 10*3/uL (ref 0.7–4.0)
MCH: 28.3 pg (ref 26.0–34.0)
MCHC: 32.7 g/dL (ref 30.0–36.0)
MCV: 86.5 fL (ref 80.0–100.0)
Monocytes Absolute: 0.4 10*3/uL (ref 0.1–1.0)
Monocytes Relative: 2 %
Neutro Abs: 20.6 10*3/uL — ABNORMAL HIGH (ref 1.7–7.7)
Neutrophils Relative %: 93 %
Platelets: 186 10*3/uL (ref 150–400)
RBC: 4.6 MIL/uL (ref 4.22–5.81)
RDW: 14.6 % (ref 11.5–15.5)
WBC: 22.2 10*3/uL — ABNORMAL HIGH (ref 4.0–10.5)
nRBC: 0 % (ref 0.0–0.2)
nRBC: 0 /100{WBCs}

## 2022-11-26 LAB — COMPREHENSIVE METABOLIC PANEL
ALT: 24 U/L (ref 0–44)
AST: 23 U/L (ref 15–41)
Albumin: 2.9 g/dL — ABNORMAL LOW (ref 3.5–5.0)
Alkaline Phosphatase: 72 U/L (ref 38–126)
Anion gap: 11 (ref 5–15)
BUN: 16 mg/dL (ref 6–20)
CO2: 23 mmol/L (ref 22–32)
Calcium: 9.2 mg/dL (ref 8.9–10.3)
Chloride: 102 mmol/L (ref 98–111)
Creatinine, Ser: 2.06 mg/dL — ABNORMAL HIGH (ref 0.61–1.24)
GFR, Estimated: 38 mL/min — ABNORMAL LOW (ref >60)
Glucose, Bld: 100 mg/dL — ABNORMAL HIGH (ref 70–99)
Potassium: 3.4 mmol/L — ABNORMAL LOW (ref 3.5–5.1)
Sodium: 136 mmol/L (ref 135–145)
Total Bilirubin: 0.7 mg/dL (ref 0.3–1.2)
Total Protein: 6.8 g/dL (ref 6.5–8.1)

## 2022-11-26 LAB — HEMOGLOBIN A1C
Hgb A1c MFr Bld: 6 % — ABNORMAL HIGH (ref 4.8–5.6)
Mean Plasma Glucose: 125.5 mg/dL

## 2022-11-26 LAB — HIV ANTIBODY (ROUTINE TESTING W REFLEX): HIV Screen 4th Generation wRfx: NONREACTIVE

## 2022-11-26 LAB — TSH: TSH: 0.602 u[IU]/mL (ref 0.350–4.500)

## 2022-11-26 LAB — I-STAT CG4 LACTIC ACID, ED: Lactic Acid, Venous: 0.9 mmol/L (ref 0.5–1.9)

## 2022-11-26 SURGERY — INCISION AND DRAINAGE, ABSCESS, PERIRECTAL
Anesthesia: General | Site: Perineum

## 2022-11-26 MED ORDER — BUPIVACAINE-EPINEPHRINE (PF) 0.25% -1:200000 IJ SOLN
INTRAMUSCULAR | Status: AC
  Filled 2022-11-26: qty 30

## 2022-11-26 MED ORDER — ONDANSETRON HCL 4 MG/2ML IJ SOLN
INTRAMUSCULAR | Status: DC | PRN
  Administered 2022-11-26: 4 mg via INTRAVENOUS

## 2022-11-26 MED ORDER — BUPIVACAINE-EPINEPHRINE 0.25% -1:200000 IJ SOLN
INTRAMUSCULAR | Status: DC | PRN
  Administered 2022-11-26: 20 mL

## 2022-11-26 MED ORDER — ONDANSETRON HCL 4 MG/2ML IJ SOLN
INTRAMUSCULAR | Status: AC
  Filled 2022-11-26: qty 2

## 2022-11-26 MED ORDER — HYDRALAZINE HCL 20 MG/ML IJ SOLN: 10.0000 mg | Freq: Four times a day (QID) | INTRAMUSCULAR | Status: DC | PRN

## 2022-11-26 MED ORDER — KETOROLAC TROMETHAMINE 30 MG/ML IJ SOLN: 30.0000 mg | Freq: Once | INTRAMUSCULAR | Status: DC | PRN

## 2022-11-26 MED ORDER — FENTANYL CITRATE (PF) 100 MCG/2ML IJ SOLN
25.0000 ug | INTRAMUSCULAR | Status: DC | PRN
  Administered 2022-11-26 (×3): 50 ug via INTRAVENOUS

## 2022-11-26 MED ORDER — SODIUM CHLORIDE 0.9 % IV SOLN
1.0000 g | Freq: Three times a day (TID) | INTRAVENOUS | Status: DC
  Administered 2022-11-27 (×2): 1 g via INTRAVENOUS
  Filled 2022-11-26 (×6): qty 20

## 2022-11-26 MED ORDER — ORAL CARE MOUTH RINSE: 15.0000 mL | Freq: Once | OROMUCOSAL | Status: AC

## 2022-11-26 MED ORDER — PROPOFOL 10 MG/ML IV BOLUS
INTRAVENOUS | Status: AC
  Filled 2022-11-26: qty 20

## 2022-11-26 MED ORDER — SUCCINYLCHOLINE CHLORIDE 200 MG/10ML IV SOSY
PREFILLED_SYRINGE | INTRAVENOUS | Status: DC | PRN
Start: 2022-11-26 — End: 2022-11-27
  Administered 2022-11-26: 100 mg via INTRAVENOUS

## 2022-11-26 MED ORDER — IBUPROFEN 400 MG PO TABS
ORAL_TABLET | ORAL | Status: AC
  Filled 2022-11-26: qty 1

## 2022-11-26 MED ORDER — PHENYLEPHRINE HCL-NACL 20-0.9 MG/250ML-% IV SOLN
INTRAVENOUS | Status: DC | PRN
  Administered 2022-11-26 (×3): 80 ug via INTRAVENOUS
  Administered 2022-11-26: 30 ug/min via INTRAVENOUS
  Administered 2022-11-26 (×3): 80 ug via INTRAVENOUS

## 2022-11-26 MED ORDER — CALCIUM CARBONATE ANTACID 500 MG PO CHEW
1.0000 | CHEWABLE_TABLET | Freq: Three times a day (TID) | ORAL | Status: DC | PRN
  Filled 2022-11-26: qty 1

## 2022-11-26 MED ORDER — DEXMEDETOMIDINE HCL IN NACL 80 MCG/20ML IV SOLN
INTRAVENOUS | Status: AC
  Filled 2022-11-26: qty 20

## 2022-11-26 MED ORDER — BISACODYL 5 MG PO TBEC: 10.0000 mg | DELAYED_RELEASE_TABLET | Freq: Every day | ORAL | Status: DC | PRN

## 2022-11-26 MED ORDER — ACETAMINOPHEN 10 MG/ML IV SOLN
INTRAVENOUS | Status: AC
  Filled 2022-11-26: qty 100

## 2022-11-26 MED ORDER — SUGAMMADEX SODIUM 200 MG/2ML IV SOLN
INTRAVENOUS | Status: DC | PRN
  Administered 2022-11-26: 400 mg via INTRAVENOUS

## 2022-11-26 MED ORDER — PIPERACILLIN-TAZOBACTAM 3.375 G IVPB 30 MIN
3.3750 g | Freq: Once | INTRAVENOUS | Status: AC
  Administered 2022-11-26: 3.375 g via INTRAVENOUS
  Filled 2022-11-26: qty 50

## 2022-11-26 MED ORDER — ACETAMINOPHEN 10 MG/ML IV SOLN: 1000.0000 mg | Freq: Once | INTRAVENOUS | Status: DC | PRN

## 2022-11-26 MED ORDER — PANTOPRAZOLE SODIUM 40 MG PO TBEC
40.0000 mg | DELAYED_RELEASE_TABLET | Freq: Every day | ORAL | Status: DC
  Administered 2022-11-27 – 2022-11-29 (×3): 40 mg via ORAL
  Filled 2022-11-26 (×3): qty 1

## 2022-11-26 MED ORDER — ENOXAPARIN SODIUM 80 MG/0.8ML IJ SOSY
75.0000 mg | PREFILLED_SYRINGE | INTRAMUSCULAR | Status: DC
  Administered 2022-11-27 – 2022-11-28 (×2): 75 mg via SUBCUTANEOUS
  Filled 2022-11-26: qty 0.8
  Filled 2022-11-26: qty 0.75
  Filled 2022-11-26: qty 0.8

## 2022-11-26 MED ORDER — ACETAMINOPHEN 10 MG/ML IV SOLN
INTRAVENOUS | Status: DC | PRN
Start: 2022-11-26 — End: 2022-11-27
  Administered 2022-11-26: 1000 mg via INTRAVENOUS

## 2022-11-26 MED ORDER — FENTANYL CITRATE (PF) 250 MCG/5ML IJ SOLN
INTRAMUSCULAR | Status: DC | PRN
  Administered 2022-11-26: 100 ug via INTRAVENOUS
  Administered 2022-11-26: 50 ug via INTRAVENOUS

## 2022-11-26 MED ORDER — AMISULPRIDE (ANTIEMETIC) 5 MG/2ML IV SOLN: 10.0000 mg | Freq: Once | INTRAVENOUS | Status: DC | PRN

## 2022-11-26 MED ORDER — SUCCINYLCHOLINE CHLORIDE 200 MG/10ML IV SOSY
PREFILLED_SYRINGE | INTRAVENOUS | Status: AC
  Filled 2022-11-26: qty 10

## 2022-11-26 MED ORDER — PROPOFOL 10 MG/ML IV BOLUS
INTRAVENOUS | Status: DC | PRN
  Administered 2022-11-26: 200 mg via INTRAVENOUS

## 2022-11-26 MED ORDER — IBUPROFEN 400 MG PO TABS
400.0000 mg | ORAL_TABLET | Freq: Once | ORAL | Status: AC | PRN
  Administered 2022-11-26: 400 mg via ORAL

## 2022-11-26 MED ORDER — LIDOCAINE 2% (20 MG/ML) 5 ML SYRINGE
INTRAMUSCULAR | Status: AC
  Filled 2022-11-26: qty 5

## 2022-11-26 MED ORDER — OXYCODONE HCL 5 MG/5ML PO SOLN: 5.0000 mg | Freq: Once | ORAL | Status: DC | PRN

## 2022-11-26 MED ORDER — MIDAZOLAM HCL 5 MG/5ML IJ SOLN
INTRAMUSCULAR | Status: DC | PRN
  Administered 2022-11-26: 2 mg via INTRAVENOUS

## 2022-11-26 MED ORDER — ALBUMIN HUMAN 5 % IV SOLN
INTRAVENOUS | Status: DC | PRN
Start: 2022-11-26 — End: 2022-11-27

## 2022-11-26 MED ORDER — ACETAMINOPHEN 325 MG PO TABS
650.0000 mg | ORAL_TABLET | Freq: Four times a day (QID) | ORAL | Status: DC | PRN
  Administered 2022-11-28 (×2): 650 mg via ORAL
  Filled 2022-11-26 (×2): qty 2

## 2022-11-26 MED ORDER — MORPHINE SULFATE (PF) 4 MG/ML IV SOLN
4.0000 mg | Freq: Once | INTRAVENOUS | Status: AC
  Administered 2022-11-26: 4 mg via INTRAVENOUS
  Filled 2022-11-26: qty 1

## 2022-11-26 MED ORDER — LIDOCAINE 2% (20 MG/ML) 5 ML SYRINGE
INTRAMUSCULAR | Status: DC | PRN
  Administered 2022-11-26: 60 mg via INTRAVENOUS

## 2022-11-26 MED ORDER — ACETAMINOPHEN 650 MG RE SUPP: 650.0000 mg | Freq: Four times a day (QID) | RECTAL | Status: DC | PRN

## 2022-11-26 MED ORDER — POLYETHYLENE GLYCOL 3350 17 G PO PACK: 17.0000 g | PACK | Freq: Every day | ORAL | Status: DC | PRN

## 2022-11-26 MED ORDER — HYDRALAZINE HCL 50 MG PO TABS: 50.0000 mg | ORAL_TABLET | Freq: Four times a day (QID) | ORAL | Status: DC | PRN

## 2022-11-26 MED ORDER — SODIUM CHLORIDE 0.9% FLUSH
3.0000 mL | Freq: Two times a day (BID) | INTRAVENOUS | Status: DC
  Administered 2022-11-27 – 2022-11-28 (×4): 3 mL via INTRAVENOUS

## 2022-11-26 MED ORDER — MIDAZOLAM HCL 2 MG/2ML IJ SOLN
INTRAMUSCULAR | Status: AC
  Filled 2022-11-26: qty 2

## 2022-11-26 MED ORDER — 0.9 % SODIUM CHLORIDE (POUR BTL) OPTIME
TOPICAL | Status: DC | PRN
  Administered 2022-11-26: 1000 mL

## 2022-11-26 MED ORDER — ONDANSETRON HCL 4 MG/2ML IJ SOLN
4.0000 mg | Freq: Once | INTRAMUSCULAR | Status: AC
  Administered 2022-11-26: 4 mg via INTRAVENOUS
  Filled 2022-11-26: qty 2

## 2022-11-26 MED ORDER — ONDANSETRON HCL 4 MG PO TABS: 4.0000 mg | ORAL_TABLET | Freq: Four times a day (QID) | ORAL | Status: DC | PRN

## 2022-11-26 MED ORDER — ROCURONIUM BROMIDE 10 MG/ML (PF) SYRINGE
PREFILLED_SYRINGE | INTRAVENOUS | Status: AC
  Filled 2022-11-26: qty 10

## 2022-11-26 MED ORDER — FENTANYL CITRATE (PF) 250 MCG/5ML IJ SOLN
INTRAMUSCULAR | Status: AC
  Filled 2022-11-26: qty 5

## 2022-11-26 MED ORDER — OXYCODONE HCL 5 MG PO TABS: 5.0000 mg | ORAL_TABLET | Freq: Once | ORAL | Status: DC | PRN

## 2022-11-26 MED ORDER — ROCURONIUM BROMIDE 10 MG/ML (PF) SYRINGE
PREFILLED_SYRINGE | INTRAVENOUS | Status: DC | PRN
  Administered 2022-11-26: 50 mg via INTRAVENOUS
  Administered 2022-11-26: 10 mg via INTRAVENOUS

## 2022-11-26 MED ORDER — CHLORHEXIDINE GLUCONATE 0.12 % MT SOLN
OROMUCOSAL | Status: AC
  Administered 2022-11-26: 15 mL via OROMUCOSAL
  Filled 2022-11-26: qty 15

## 2022-11-26 MED ORDER — FENTANYL CITRATE (PF) 100 MCG/2ML IJ SOLN
INTRAMUSCULAR | Status: AC
  Filled 2022-11-26: qty 2

## 2022-11-26 MED ORDER — SODIUM CHLORIDE 0.9 % IV SOLN: INTRAVENOUS | Status: DC

## 2022-11-26 MED ORDER — SODIUM CHLORIDE 0.9 % IV BOLUS
1000.0000 mL | Freq: Once | INTRAVENOUS | Status: AC
  Administered 2022-11-26: 1000 mL via INTRAVENOUS

## 2022-11-26 MED ORDER — MORPHINE SULFATE (PF) 2 MG/ML IV SOLN
2.0000 mg | INTRAVENOUS | Status: DC | PRN
  Administered 2022-11-26 – 2022-11-29 (×11): 2 mg via INTRAVENOUS
  Filled 2022-11-26 (×11): qty 1

## 2022-11-26 MED ORDER — IOHEXOL 350 MG/ML SOLN
60.0000 mL | Freq: Once | INTRAVENOUS | Status: AC | PRN
  Administered 2022-11-26: 60 mL via INTRAVENOUS

## 2022-11-26 MED ORDER — CHLORHEXIDINE GLUCONATE 0.12 % MT SOLN: 15.0000 mL | Freq: Once | OROMUCOSAL | Status: AC

## 2022-11-26 MED ORDER — ONDANSETRON HCL 4 MG/2ML IJ SOLN: 4.0000 mg | Freq: Four times a day (QID) | INTRAMUSCULAR | Status: DC | PRN

## 2022-11-26 SURGICAL SUPPLY — 43 items
BAG COUNTER SPONGE SURGICOUNT (BAG) ×2 IMPLANT
BAG SPNG CNTER NS LX DISP (BAG) ×1
BLADE SURG 10 STRL SS (BLADE) IMPLANT
BNDG GAUZE DERMACEA FLUFF 4 (GAUZE/BANDAGES/DRESSINGS) IMPLANT
BNDG GZE DERMACEA 4 6PLY (GAUZE/BANDAGES/DRESSINGS) ×2
BRIEF MESH DISP LRG (UNDERPADS AND DIAPERS) ×2 IMPLANT
CANISTER SUCT 3000ML PPV (MISCELLANEOUS) ×2 IMPLANT
COVER SURGICAL LIGHT HANDLE (MISCELLANEOUS) ×2 IMPLANT
DRAPE LAPAROSCOPIC ABDOMINAL (DRAPES) IMPLANT
ELECT REM PT RETURN 9FT ADLT (ELECTROSURGICAL) ×1
ELECTRODE REM PT RTRN 9FT ADLT (ELECTROSURGICAL) ×2 IMPLANT
GAUZE PACKING IODOFORM 1/2INX (GAUZE/BANDAGES/DRESSINGS) IMPLANT
GAUZE PAD ABD 8X10 STRL (GAUZE/BANDAGES/DRESSINGS) ×2 IMPLANT
GAUZE SPONGE 4X4 12PLY STRL (GAUZE/BANDAGES/DRESSINGS) ×2 IMPLANT
GLOVE BIO SURGEON STRL SZ8 (GLOVE) ×2 IMPLANT
GLOVE BIOGEL PI IND STRL 8 (GLOVE) ×2 IMPLANT
GOWN STRL REUS W/ TWL LRG LVL3 (GOWN DISPOSABLE) ×2 IMPLANT
GOWN STRL REUS W/ TWL XL LVL3 (GOWN DISPOSABLE) ×2 IMPLANT
GOWN STRL REUS W/TWL LRG LVL3 (GOWN DISPOSABLE) ×1
GOWN STRL REUS W/TWL XL LVL3 (GOWN DISPOSABLE) ×1
KIT BASIN OR (CUSTOM PROCEDURE TRAY) ×2 IMPLANT
KIT TURNOVER KIT B (KITS) ×2 IMPLANT
NDL HYPO 25GX1X1/2 BEV (NEEDLE) IMPLANT
NDL SPNL 18GX3.5 QUINCKE PK (NEEDLE) IMPLANT
NDL SPNL 22GX3.5 QUINCKE BK (NEEDLE) IMPLANT
NEEDLE HYPO 25GX1X1/2 BEV (NEEDLE) ×1 IMPLANT
NEEDLE SPNL 18GX3.5 QUINCKE PK (NEEDLE) ×1 IMPLANT
NEEDLE SPNL 22GX3.5 QUINCKE BK (NEEDLE) IMPLANT
NS IRRIG 1000ML POUR BTL (IV SOLUTION) ×2 IMPLANT
PACK LITHOTOMY IV (CUSTOM PROCEDURE TRAY) ×2 IMPLANT
PAD ARMBOARD 7.5X6 YLW CONV (MISCELLANEOUS) ×2 IMPLANT
PENCIL SMOKE EVACUATOR (MISCELLANEOUS) ×2 IMPLANT
SPONGE T-LAP 18X18 ~~LOC~~+RFID (SPONGE) ×2 IMPLANT
SWAB COLLECTION DEVICE MRSA (MISCELLANEOUS) IMPLANT
SWAB CULTURE ESWAB REG 1ML (MISCELLANEOUS) IMPLANT
SYR 10ML LL (SYRINGE) IMPLANT
SYR BULB IRRIG 60ML STRL (SYRINGE) ×2 IMPLANT
SYR CONTROL 10ML LL (SYRINGE) IMPLANT
TOWEL GREEN STERILE (TOWEL DISPOSABLE) ×2 IMPLANT
TOWEL GREEN STERILE FF (TOWEL DISPOSABLE) ×2 IMPLANT
TUBE CONNECTING 12X1/4 (SUCTIONS) ×2 IMPLANT
UNDERPAD 30X36 HEAVY ABSORB (UNDERPADS AND DIAPERS) ×2 IMPLANT
YANKAUER SUCT BULB TIP NO VENT (SUCTIONS) ×2 IMPLANT

## 2022-11-26 NOTE — ED Provider Notes (Signed)
[place] EMERGENCY DEPARTMENT AT [facility] Provider Note   CSN: [id number] Arrival date & time: 11/26/22  7829     History  Chief Complaint  Patient presents with   Abscess    Parker Clay is a 52 y.o. male.  Pt is a 52 yo male with pmhx significant for htn and obesity.  Pt said he developed an abscess to his rectum about 3 days ago.  He did go to UC yesterday and was told to go to the ED.  He was not give a rx for abx because he was supposed to come straight here.  Pt thought it would get better, so he went home instead.  He took 1 of his daughter's amox left over abx.  He said the swelling is worse now and is spreading to to his scrotum and perineum.  He has had fevers intermittently.         Home Medications Prior to Admission medications   Medication Sig Start Date End Date Taking? Authorizing Provider  amLODipine (NORVASC) 10 MG tablet Take 1 tablet (10 mg total) by mouth daily. 03/15/22  Yes Debby Freiberg, NP  lisinopril-hydrochlorothiazide (ZESTORETIC) 20-12.5 MG tablet Take 1 tablet by mouth daily. 03/15/22  Yes Debby Freiberg, NP  pantoprazole (PROTONIX) 40 MG tablet Take 1 tablet (40 mg total) by mouth daily. 03/15/22  Yes Debby Freiberg, NP  sildenafil (REVATIO) 20 MG tablet Take 20 mg by mouth daily as needed (erectile dsyfunction). 07/29/20  Yes [provider]  olopatadine (PATADAY) 0.1 % ophthalmic solution Place 1 drop into both eyes 2 (two) times daily. Patient not taking: Reported on 11/26/2022 08/10/20   Raspet, Noberto Retort, PA-C  predniSONE (STERAPRED UNI-PAK 21 TAB) 10 MG (21) TBPK tablet As directed Patient not taking: Reported on 11/26/2022 08/10/20   Raspet, Denny Peon K, PA-C  tiZANidine (ZANAFLEX) 4 MG capsule Take 1 capsule (4 mg total) by mouth 2 (two) times daily as needed for muscle spasms. Patient not taking: Reported on 11/26/2022 08/10/20   Raspet, Noberto Retort, PA-C  olmesartan-hydrochlorothiazide (BENICAR HCT) 40-12.5 MG per tablet  Take 1 tablet by mouth daily.    05/02/11  [provider]      Allergies    Patient has no known allergies.    Review of Systems   Review of Systems  Constitutional:  Positive for fatigue.  Gastrointestinal:  Positive for rectal pain.  Genitourinary:  Positive for scrotal swelling.  All other systems reviewed and are negative.   Physical Exam Updated Vital Signs BP 112/71   Pulse 87   Temp 98.2 F (36.8 C) (Oral)   Resp 18   Ht 5' 10.5" (1.791 m)   Wt (!) 154.2 kg   SpO2 99%   BMI 48.10 kg/m  Physical Exam Vitals and nursing note reviewed. Exam conducted with a chaperone present.  Constitutional:      Appearance: Normal appearance. He is obese.  HENT:     Head: Normocephalic and atraumatic.     Right Ear: External ear normal.     Left Ear: External ear normal.     Nose: Nose normal.     Mouth/Throat:     Mouth: Mucous membranes are moist.     Pharynx: Oropharynx is clear.  Eyes:     Extraocular Movements: Extraocular movements intact.     Conjunctiva/sclera: Conjunctivae normal.     Pupils: Pupils are equal, round, and reactive to light.  Cardiovascular:     Rate  and Rhythm: Normal rate and regular rhythm.     Pulses: Normal pulses.     Heart sounds: Normal heart sounds.  Pulmonary:     Effort: Pulmonary effort is normal.     Breath sounds: Normal breath sounds.  Abdominal:     General: Abdomen is flat. Bowel sounds are normal.     Palpations: Abdomen is soft.  Genitourinary:    Comments: Perirectal abscess with swelling to perineum and scrotum.  LAD in left groin. Musculoskeletal:        General: Normal range of motion.     Cervical back: Normal range of motion and neck supple.  Skin:    General: Skin is warm.     Capillary Refill: Capillary refill takes less than 2 seconds.  Neurological:     General: No focal deficit present.     Mental Status: He is alert and oriented to person, place, and time.  Psychiatric:        Mood and Affect: Mood  normal.        Behavior: Behavior normal.     ED Results / Procedures / Treatments   Labs (all labs ordered are listed, but only abnormal results are displayed) Labs Reviewed  COMPREHENSIVE METABOLIC PANEL - Abnormal; Notable for the following components:      Result Value   Potassium 3.4 (*)    Glucose, Bld 100 (*)    Creatinine, Ser 2.06 (*)    Albumin 2.9 (*)    GFR, Estimated 38 (*)    All other components within normal limits  CBC WITH DIFFERENTIAL/PLATELET - Abnormal; Notable for the following components:   WBC 22.2 (*)    Neutro Abs 20.6 (*)    All other components within normal limits  CULTURE, BLOOD (ROUTINE X 2)  CULTURE, BLOOD (ROUTINE X 2)  I-STAT CG4 LACTIC ACID, ED    EKG None  Radiology CT ABDOMEN PELVIS W CONTRAST  Result Date: 11/26/2022 CLINICAL DATA:  Abdominal pain, concern for Fournier's gangrene EXAM: CT ABDOMEN AND PELVIS WITH CONTRAST TECHNIQUE: Multidetector CT imaging of the abdomen and pelvis was performed using the standard protocol following bolus administration of intravenous contrast. RADIATION DOSE REDUCTION: This exam was performed according to the departmental dose-optimization program which includes automated exposure control, adjustment of the mA and/or kV according to patient size and/or use of iterative reconstruction technique. CONTRAST:  60mL OMNIPAQUE IOHEXOL 350 MG/ML SOLN COMPARISON:  CT abdomen and pelvis dated August 17, 2020 FINDINGS: Lower chest: No acute abnormality. Hepatobiliary: Hepatic steatosis. No focal liver abnormality is seen. No gallstones, gallbladder wall thickening, or biliary dilatation. Pancreas: Unremarkable. No pancreatic ductal dilatation or surrounding inflammatory changes. Spleen: Normal in size without focal abnormality. Adrenals/Urinary Tract: Stable small nodule of the right adrenal gland measuring 7 mm, likely an adenoma. No hydronephrosis or nephrolithiasis. Small low-attenuation lesion of the lower pole of the  left kidney, likely a simple cysts but too small to accurately characterize, no specific follow-up imaging is necessary. Stomach/Bowel: Stomach is within normal limits. Appendix appears normal. No evidence of bowel wall thickening, distention, or inflammatory changes. Vascular/Lymphatic: No significant vascular findings are present. Enlarged left inguinal lymph nodes. Reference node measuring 12 mm in short axis on series 2, image 85. Reproductive: Prostate is unremarkable. Other: Soft tissue gas of the left scrotum and left mid thigh. No intra-abdominal free air or free fluid. Small fat containing left inguinal hernia. 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 Evidence of prior umbilical hernia repair. Musculoskeletal: No acute or significant osseous findings. IMPRESSION: 1. Soft tissue gas of the left scrotum and left mid thigh, findings are consistent with Fournier's gangrene. 2. Enlarged left inguinal lymph nodes, likely reactive. 3. 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 4. Hepatic steatosis. Critical Value/emergent results (Fournier's gangrene) were called by telephone at the time of interpretation on 11/26/2022 at 2:21 pm to provider [facility] , who verbally acknowledged these results. Electronically Signed   By: Allegra Lai M.D.   On: 11/26/2022 14:47    Procedures Procedures    Medications Ordered in ED Medications  ibuprofen (ADVIL) tablet 400 mg (400 mg Oral Given 11/26/22 0831)  sodium chloride 0.9 % bolus 1,000 mL (1,000 mLs Intravenous New Bag/Given 11/26/22 1157)  piperacillin-tazobactam (ZOSYN) IVPB 3.375 g (0 g Intravenous Stopped 11/26/22 1311)  morphine (PF) 4 MG/ML injection  4 mg (4 mg Intravenous Given 11/26/22 1329)  ondansetron (ZOFRAN) injection 4 mg (4 mg Intravenous Given 11/26/22 1329)  iohexol (OMNIPAQUE) 350 MG/ML injection 60 mL (60 mLs Intravenous Contrast Given 11/26/22 1232)    ED Course/ Medical Decision Making/ A&P                                 Medical Decision Making Amount and/or Complexity of Data Reviewed Labs: ordered. Radiology: ordered.  Risk Prescription drug management. Decision regarding hospitalization.   This patient presents to the ED for concern of rectal abscess, this involves an extensive number of treatment options, and is a complaint that carries with it a high risk of complications and morbidity.  The differential diagnosis includes abscess, cellulitis, fournier's gangrene   Co morbidities that complicate the patient evaluation  Htn and obesity   Additional history obtained:  Additional history obtained from epic chart review  Lab Tests:  I Ordered, and personally interpreted labs.  The pertinent results include:  cbc with wbc elevated at 22.2; cmp with cr elevated at 2.06 (cr 1.42 in 2020),    Imaging Studies ordered:  I ordered imaging studies including ct abd/pelvis  I independently visualized and interpreted imaging which showed  Soft tissue gas of the left scrotum and left mid thigh, findings  are consistent with Fournier's gangrene.  2. Enlarged left inguinal lymph nodes, likely reactive.  3. 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  4. Hepatic steatosis.   I agree with the radiologist interpretation   Cardiac Monitoring:  The patient was maintained on a cardiac monitor.  I personally viewed and interpreted the cardiac monitored which showed an underlying rhythm of: nsr   Medicines ordered and prescription drug management:  I ordered medication including  zosyn  for infection  Reevaluation of the patient after these medicines showed that the patient improved I have reviewed the patients home medicines and have made adjustments as needed   Test Considered:  ct   Critical Interventions:  Ivfs/iv zosyn   Consultations Obtained:  I requested consultation with general surgeon,  and discussed lab and imaging findings as well as pertinent plan - they will see pt as  consult Pt d/w Dr. Lucianne Muss (triad) for admission   Problem List / ED Course:  Fournier's gangrene: Pt given iv zosyn.  BP improved after 1L NS.  Pt d/w surgery who will consult.  Pt d/w medicine who will admit.   Reevaluation:  After the interventions noted above, I reevaluated the patient and found that they have :improved   Social Determinants of Health:  Lives at home   Dispostion:  After consideration of the diagnostic results and the patients response to treatment, I feel that the patent would benefit from admission.          Final Clinical Impression(s) / ED Diagnoses Final diagnoses:  Fournier's gangrene in male    Rx / DC Orders ED Discharge Orders     None         Jacalyn Lefevre, MD 11/26/22 1455

## 2022-11-26 NOTE — Transfer of Care (Signed)
Immediate Anesthesia Transfer of Care Note  Patient: Parker Clay  Procedure(s) Performed: IRRIGATION AND DEBRIDEMENT AND EXAMINE UNDER ANESTHESIA (Perineum)  Patient Location: PACU  Anesthesia Type:General  Level of Consciousness: awake, alert , and patient cooperative  Airway & Oxygen Therapy: Patient Spontanous Breathing  Post-op Assessment: Report given to RN, Post -op Vital signs reviewed and stable, and Patient moving all extremities X 4  Post vital signs: Reviewed and stable  Last Vitals:  Vitals Value Taken Time  BP    Temp    Pulse 93 11/26/22 1934  Resp 18 11/26/22 1934  SpO2 96 % 11/26/22 1934  Vitals shown include unfiled device data.  Last Pain:  Vitals:   11/26/22 1705  TempSrc: Oral  PainSc: 2       Patients Stated Pain Goal: 0 (11/26/22 1705)  Complications:  Encounter Notable Events  Notable Event Outcome Phase Comment  Difficult to intubate - expected  Intraprocedure Filed from anesthesia note documentation.

## 2022-11-26 NOTE — ED Triage Notes (Signed)
Patient arrives for further eval of peri-anal abscess present since Friday. He went to [facility] yesterday for same yesterday and was advised to come to the ED. Fever since Friday, tmax 103.

## 2022-11-26 NOTE — Consult Note (Signed)
Parker Clay 10/01/1970  [id number].    Requesting MD: Particia Nearing, MD Chief Complaint/Reason for Consult: Fournier's gangrene   HPI:  Parker Clay is a 52 y/o M with a PMH HTN, buttock abscess s/p bedside I&D, and obesity who presents with a cc increasing perirectal pain and swelling. He was seen in urgent care yesterday 10/7 where he c/o 3 days of perianal pain, fever, and chills. In urgent care his HR was 110 bpm and his temp was 101. On exam the provider noted perianal induration extending anteriorly towards the scrotum. The patient was instructed to go directly to the [facility] ED for further workup/management of suspected perianal abscess. The patient did not go directly to the ED. He went home and took one of his daughter's leftover amoxicillin tablets, hoping the infection would improve. He presents today with increase in perineal discomfort and he says he now has swelling of his scrotum. Febrile to 103 at home. He reports darkened urine and no BM since Saturday as well.   He denies chest pain, SOB, dysuria, hematuria Blood thinners: none Surgical history: open ventral hernia repair w/ mesh 2019 at [facility]  ROS: negative, as above Review of Systems  All other systems reviewed and are negative.   Family History  Problem Relation Age of Onset   Cancer Mother 21       breast   Cancer Father 20       prostate    Past Medical History:  Diagnosis Date   Hypertension    Obesity     No past surgical history on file.  Social History:  reports that he has been smoking cigars. He has never used smokeless tobacco. He reports current alcohol use. He reports that he does not use drugs.  Allergies: No Known Allergies  (Not in a hospital admission)    Physical Exam: Blood pressure 102/68, pulse 89, temperature 98.2 F (36.8 C), temperature source Oral, resp. rate 18, height 5' 10.5" (1.791 m), weight (!) 154.2 kg, SpO2 99%. General: Pleasant obese male in no acute  distress, non-toxic appearing  HEENT: head -normocephalic, atraumatic; Eyes: EOMI, no conjunctival injection, anicteric sclerae Neck- Trachea is midline CV- RRR, sinus tachycardia, radial and dorsalis pedis pulses 2+ BL, cap refill < 2 seconds. Pulm- breathing is non-labored ORA Abd- soft, obese, Nontender, nondistended GU- pain with DRE where there is fullness noted anteriorly, there is erythema and induration of the perineal region tracking anteriorly towards the scrotum along the left aspect. There is no fluctuance and no real crepitus on exam. There is no active drainage or open wound. MSK- UE/LE symmetrical, no cyanosis, clubbing, or edema. Neuro- CN II-XII grossly in tact, no paresthesias. Psych- Alert and Oriented x3 with appropriate affect Skin: warm and dry, no rashes   Results for orders placed or performed during the hospital encounter of 11/26/22 (from the past 48 hour(s))  Comprehensive metabolic panel     Status: Abnormal   Collection Time: 11/26/22  8:30 AM  Result Value Ref Range   Sodium 136 135 - 145 mmol/L   Potassium 3.4 (L) 3.5 - 5.1 mmol/L   Chloride 102 98 - 111 mmol/L   CO2 23 22 - 32 mmol/L   Glucose, Bld 100 (H) 70 - 99 mg/dL    Comment: Glucose reference range applies only to samples taken after fasting for at least 8 hours.   BUN 16 6 - 20 mg/dL   Creatinine, Ser 5.78 (H) 0.61 - 1.24 mg/dL  Calcium 9.2 8.9 - 10.3 mg/dL   Total Protein 6.8 6.5 - 8.1 g/dL   Albumin 2.9 (L) 3.5 - 5.0 g/dL   AST 23 15 - 41 U/L   ALT 24 0 - 44 U/L   Alkaline Phosphatase 72 38 - 126 U/L   Total Bilirubin 0.7 0.3 - 1.2 mg/dL   GFR, Estimated 38 (L) >60 mL/min    Comment: (NOTE) Calculated using the CKD-EPI Creatinine Equation (2021)    Anion gap 11 5 - 15    Comment: Performed at [facility], [address], [place], [place] [address]  CBC with Differential     Status: Abnormal   Collection Time: 11/26/22  8:30 AM  Result Value Ref Range   WBC 22.2 (H) 4.0 - 10.5  K/uL   RBC 4.60 4.22 - 5.81 MIL/uL   Hemoglobin 13.0 13.0 - 17.0 g/dL   HCT 29.5 18.8 - 41.6 %   MCV 86.5 80.0 - 100.0 fL   MCH 28.3 26.0 - 34.0 pg   MCHC 32.7 30.0 - 36.0 g/dL   RDW 60.6 30.1 - 60.1 %   Platelets 186 150 - 400 K/uL   nRBC 0.0 0.0 - 0.2 %   Neutrophils Relative % 93 %   Neutro Abs 20.6 (H) 1.7 - 7.7 K/uL   Lymphocytes Relative 4 %   Lymphs Abs 0.9 0.7 - 4.0 K/uL   Monocytes Relative 2 %   Monocytes Absolute 0.4 0.1 - 1.0 K/uL   Eosinophils Relative 1 %   Eosinophils Absolute 0.2 0.0 - 0.5 K/uL   Basophils Relative 0 %   Basophils Absolute 0.0 0.0 - 0.1 K/uL   WBC Morphology See Note     Comment: Increased Bands. >20% Bands   nRBC 0 0 /100 WBC   Abs Immature Granulocytes 0.00 0.00 - 0.07 K/uL   Polychromasia PRESENT     Comment: Performed at [facility], [address], [place], [place] [address]  I-Stat Lactic Acid, ED     Status: None   Collection Time: 11/26/22  8:40 AM  Result Value Ref Range   Lactic Acid, Venous 0.9 0.5 - 1.9 mmol/L   No results found.    Assessment/Plan Fournier's gangrene  - afebrile currently, WBC 22.2  - CT abdomen pelvis with soft tissue gas involving the left mid thigh and scrotum concerning for necrotizing soft tissue infection. - recommend proceeding to the OR for emergent debridement  - recommend medical admission   FEN - NPO, IVF VTE - SCD's ID - Zosyn Admit - TRH service  Incidental finding of fat containing left inguinal hernia   I reviewed ED provider notes, last 24 h vitals and pain scores, last 48 h intake and output, last 24 h labs and trends, and last 24 h imaging results.  Trixie Deis, [facility] 11/26/2022, 2:38 PM Please see Amion for pager number during day hours 7:00am-4:30pm or 7:00am -11:30am on weekends

## 2022-11-26 NOTE — ED Notes (Signed)
Patient transported to CT 

## 2022-11-26 NOTE — Op Note (Addendum)
Operative Note  Preoperative diagnosis:  1.  Fournier's gangrene  Postoperative diagnosis: 1.  Fournier's gangrene  Procedure(s): 1.  Scrotal exploration/incision and drainage  Surgeon: Modena Slater, MD  Co-surgeon: Melody Haver  Anesthesia: General  Complications: None immediate  EBL: Minimal  Specimens: 1.  None for my portion  Drains/Catheters: 1.  None for my portion  Intraoperative findings: Patient had Fournier's gangrene.  There was a pocket of pus within the scrotum that was drained with tissue appeared viable  Indication: 52 year old male with scrotal and perineal Fournier's gangrene  Description of procedure:  Upon entering the operating room, the patient was in lithotomy and under general anesthesia.  Dr. Hillery Hunter had explored the perineum.  I scrubbed and opened the scrotum on the left by extending the perineal incision.  There is a pocket of pus that was drained.  Tissue underneath appeared viable and minimal debridement was required.  This concluded my portion of the operation.  Plan: As per general surgery.  Will be available as needed.

## 2022-11-26 NOTE — OR Nursing (Signed)
Care of patient assumed at 1900.

## 2022-11-26 NOTE — Anesthesia Procedure Notes (Signed)
Procedure Name: Intubation Date/Time: 11/26/2022 6:11 PM  Performed by: Cy Blamer, CRNAPre-anesthesia Checklist: Patient identified, Emergency Drugs available, Suction available and Patient being monitored Patient Re-evaluated:Patient Re-evaluated prior to induction Oxygen Delivery Method: Circle system utilized Preoxygenation: Pre-oxygenation with 100% oxygen Induction Type: IV induction and Rapid sequence Laryngoscope Size: Glidescope and 4 Grade View: Grade I Tube type: Oral Tube size: 8.0 mm Number of attempts: 1 Airway Equipment and Method: Stylet, Oral airway and Video-laryngoscopy Placement Confirmation: ETT inserted through vocal cords under direct vision, positive ETCO2 and breath sounds checked- equal and bilateral Secured at: 24 cm Tube secured with: Tape Dental Injury: Teeth and Oropharynx as per pre-operative assessment  Difficulty Due To: Difficulty was anticipated and Difficult Airway- due to limited oral opening Future Recommendations: Recommend- induction with short-acting agent, and alternative techniques readily available

## 2022-11-26 NOTE — Op Note (Addendum)
Preoperative diagnosis: Fournier's Gangrene  Postoperative diagnosis: same   Procedure:  Excision of soft tissue infection of the perineum (20cm x 8cm x 4cm) Anorectal exam under anesthesia  Scrotal exploration (Dr. Alvester Morin, urology)  Surgeon: Melody Haver, M.D. Co-Surgeon: Modena Slater, MD (for scrotal exploration)  Anesthesia: General  Indications for procedure: Parker Clay is a 52 y.o. year old male who presented to the ED with several days of swelling and pain to perineum.  Based on exam, imaging, and labs there was concern for Fournier's gangrene.  Surgical exploration was recommended and following a discussion of the risks and benefits, the patient provided written consent.   Description of procedure: The patient was brought into the operative suite. Anesthesia was administered with General endotracheal anesthesia. WHO checklist was applied. The patient was then placed in lithotomy position with yellow fins. The area was prepped and draped in the usual sterile fashion. A #10 blade was used to make a stab incision in the left perineum at an area of fluctuance.  Murky, foul-smelling fluid was encountered.  Cultures were sent.  The cavity was probed bluntly and found to track cephalad along the inguinal crease toward the scrotum.  There was no evidence of rectal involvement on exam.  The skin and soft tissue overlying the cavity was incised using electrocautery.  There was necrotic appearing fat as well as foul-smelling drainage.  The necrotic tissue was debrided sharply with scissors until there was bleeding and the tissue appeared healthy.  The final wound measured 20cm x 8cm x 4cm.  There was induration and erythema along the inferior scrotum, so I made the decision to consult urology for an intra-operative evaluation.  Dr. Alvester Morin with urology came to the OR and proceeded to perform a scrotal exploration.  Please see his operative note for complete documentation.  I examined the wound again and  did not find any evidence of additional necrotic tissue.  Hemostasis was achieved using electrocautery.  The wound was packed with a moist kerlix and covered with an ABD.  The instrument and needle counts were reported as correct x 2.    Findings: Necrotic issue along the left gluteal cleft tracking along the inguinal crease consistent with soft tissue infection. Scrotal abscess drained by urology.   Specimen: Perineal fluid for culture  Implant: None   Blood loss:  Complications: None  Melody Haver, M.D. General Surgery [facility], [place]

## 2022-11-26 NOTE — H&P (Signed)
[facility] History and Physical   Patient: Parker Clay YQM:[id number]   PCP: Patient, No Pcp Per DOB: 1970-06-30   DOA: 11/26/2022   DOS: 11/26/2022   DOS: the patient was seen and examined on 11/26/2022  Patient coming from: The patient is coming from Home  Chief Complaint: Fever and Abscess at the bottom area since Friday   HPI: Parker Clay is a 52 y.o. male with Past medical history of hypertension, migraine, left eyelid ptosis, obesity BMI 48, as reviewed from EMR, presented at [facility], ED with complaining of fever since Friday and perineal abscess.  As per patient he started having.  Abscess since Friday and running fever, patient did not go anywhere on Saturday, on Sunday he took 1 dose of antibiotics which he had at home, yesterday he was seen at urgent care and he was told to go to ED but he went home and today came in with fever and worsening of pain in the perineal area due to abscess.  Denied any other complaints, no chest pain or palpitation, no shortness of breath, no abdominal pain.   ED Course: VS afebrile, HR 98, RR 16, BP 110/72, 100% on room air BMP, hypokalemia potassium 3.4, mild hyperglycemia blood glucose 100, AKI, creatinine 2.06, baseline creatinine 1.4.  Hypoalbuminemia albumin 2.9, Lactic acid 0.9 within normal range Leukocytosis WBC 22.2 CT abdomen and pelvis: 1. Soft tissue gas of the left scrotum and left mid thigh, findings are consistent with Fournier's gangrene. 2. Enlarged left inguinal lymph nodes, likely reactive. 3. 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 4. Hepatic steatosis.   Review of Systems: as mentioned in the history of present illness.  All other systems reviewed and are negative.  Past Medical History:  Diagnosis Date   Hypertension    Obesity    No past surgical history on file. Social  History:  reports that he has been smoking cigars. He has never used smokeless tobacco. He reports current alcohol use. He reports that he does not use drugs.  No Known Allergies  Family history reviewed and not pertinent Family History  Problem Relation Age of Onset   Cancer Mother 53       breast   Cancer Father 3       prostate     Prior to Admission medications   Medication Sig Start Date End Date Taking? Authorizing Provider  amLODipine (NORVASC) 10 MG tablet Take 1 tablet (10 mg total) by mouth daily. 03/15/22  Yes Debby Freiberg, NP  lisinopril-hydrochlorothiazide (ZESTORETIC) 20-12.5 MG tablet Take 1 tablet by mouth daily. 03/15/22  Yes Debby Freiberg, NP  pantoprazole (PROTONIX) 40 MG tablet Take 1 tablet (40 mg total) by mouth daily. 03/15/22  Yes Debby Freiberg, NP  sildenafil (REVATIO) 20 MG tablet Take 20 mg by mouth daily as needed (erectile dsyfunction). 07/29/20  Yes [provider]  olopatadine (PATADAY) 0.1 % ophthalmic solution Place 1 drop into both eyes 2 (two) times daily. Patient not taking: Reported on 11/26/2022 08/10/20   Raspet, Noberto Retort, PA-C  predniSONE (STERAPRED UNI-PAK 21 TAB) 10 MG (21) TBPK tablet As directed Patient not taking: Reported on 11/26/2022 08/10/20   Raspet, Denny Peon K, PA-C  tiZANidine (ZANAFLEX) 4 MG capsule Take 1 capsule (4 mg total) by mouth 2 (two) times daily as needed for muscle spasms. Patient not taking: Reported on  11/26/2022 08/10/20   Raspet, Noberto Retort, PA-C  olmesartan-hydrochlorothiazide (BENICAR HCT) 40-12.5 MG per tablet Take 1 tablet by mouth daily.    05/02/11  [provider]    Physical Exam: Vitals:   11/26/22 1153 11/26/22 1155 11/26/22 1330 11/26/22 1430  BP:  (!) 101/59 102/68 112/71  Pulse:  91 89 87  Resp:  18 18 18   Temp: 98.2 F (36.8 C)     TempSrc: Oral     SpO2:  99% 99% 99%  Weight:      Height:        General: alert and oriented to time, place, and person. Appear in mild distress,  affect appropriate Eyes: PERRLA, Conjunctiva normal ENT: Oral Mucosa Clear, moist  Neck: no JVD, no Abnormal Mass Or lumps Cardiovascular: S1 and S2 Present, no Murmur, peripheral pulses symmetrical Respiratory: good respiratory effort, Bilateral Air entry equal and Decreased, no signs of accessory muscle use, Clear to Auscultation, no Crackles, no wheezes Abdomen: Bowel Sound present, Soft and no tenderness, no hernia Skin: Perineal area tenderness, mild erythema Extremities: no Pedal edema, no calf tenderness Neurologic: without any new focal findings Gait not checked due to patient safety concerns  Data Reviewed: I have personally reviewed and interpreted labs, imaging as discussed below.  CBC: Recent Labs  Lab 11/26/22 0830  WBC 22.2*  NEUTROABS 20.6*  HGB 13.0  HCT 39.8  MCV 86.5  PLT 186   Basic Metabolic Panel: Recent Labs  Lab 11/26/22 0830  NA 136  K 3.4*  CL 102  CO2 23  GLUCOSE 100*  BUN 16  CREATININE 2.06*  CALCIUM 9.2   GFR: Estimated Creatinine Clearance: 62.3 mL/min (A) (by C-G formula based on SCr of 2.06 mg/dL (H)). Liver Function Tests: Recent Labs  Lab 11/26/22 0830  AST 23  ALT 24  ALKPHOS 72  BILITOT 0.7  PROT 6.8  ALBUMIN 2.9*   No results for input(s): "LIPASE", "AMYLASE" in the last 168 hours. No results for input(s): "AMMONIA" in the last 168 hours. Coagulation Profile: No results for input(s): "INR", "PROTIME" in the last 168 hours. Cardiac Enzymes: No results for input(s): "CKTOTAL", "CKMB", "CKMBINDEX", "TROPONINI" in the last 168 hours. BNP (last 3 results) No results for input(s): "PROBNP" in the last 8760 hours. HbA1C: No results for input(s): "HGBA1C" in the last 72 hours. CBG: No results for input(s): "GLUCAP" in the last 168 hours. Lipid Profile: No results for input(s): "CHOL", "HDL", "LDLCALC", "TRIG", "CHOLHDL", "LDLDIRECT" in the last 72 hours. Thyroid Function Tests: No results for input(s): "TSH", "T4TOTAL",  "FREET4", "T3FREE", "THYROIDAB" in the last 72 hours. Anemia Panel: No results for input(s): "VITAMINB12", "FOLATE", "FERRITIN", "TIBC", "IRON", "RETICCTPCT" in the last 72 hours. Urine analysis:    Component Value Date/Time   BILIRUBINUR N 12/02/2012 1032   PROTEINUR N 12/02/2012 1032   UROBILINOGEN negative 12/02/2012 1032   NITRITE N 12/02/2012 1032   LEUKOCYTESUR Negative 12/02/2012 1032    Radiological Exams on Admission: CT ABDOMEN PELVIS W CONTRAST  Result Date: 11/26/2022 CLINICAL DATA:  Abdominal pain, concern for Fournier's gangrene EXAM: CT ABDOMEN AND PELVIS WITH CONTRAST TECHNIQUE: Multidetector CT imaging of the abdomen and pelvis was performed using the standard protocol following bolus administration of intravenous contrast. RADIATION DOSE REDUCTION: This exam was performed according to the departmental dose-optimization program which includes automated exposure control, adjustment of the mA and/or kV according to patient size and/or use of iterative reconstruction technique. CONTRAST:  60mL OMNIPAQUE IOHEXOL 350 MG/ML SOLN COMPARISON:  CT abdomen and pelvis dated August 17, 2020 FINDINGS: Lower chest: No acute abnormality. Hepatobiliary: Hepatic steatosis. No focal liver abnormality is seen. No gallstones, gallbladder wall thickening, or biliary dilatation. Pancreas: Unremarkable. No pancreatic ductal dilatation or surrounding inflammatory changes. Spleen: Normal in size without focal abnormality. Adrenals/Urinary Tract: Stable small nodule of the right adrenal gland measuring 7 mm, likely an adenoma. No hydronephrosis or nephrolithiasis. Small low-attenuation lesion of the lower pole of the left kidney, likely a simple cysts but too small to accurately characterize, no specific follow-up imaging is necessary. Stomach/Bowel: Stomach is within normal limits. Appendix appears normal. No evidence of bowel wall thickening, distention, or inflammatory changes. Vascular/Lymphatic: No  significant vascular findings are present. Enlarged left inguinal lymph nodes. Reference node measuring 12 mm in short axis on series 2, image 85. Reproductive: Prostate is unremarkable. Other: Soft tissue gas of the left scrotum and left mid thigh. No intra-abdominal free air or free fluid. Small fat containing left inguinal hernia. 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 Evidence of prior umbilical hernia repair. Musculoskeletal: No acute or significant osseous findings. IMPRESSION: 1. Soft tissue gas of the left scrotum and left mid thigh, findings are consistent with Fournier's gangrene. 2. Enlarged left inguinal lymph nodes, likely reactive. 3. 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 4. Hepatic steatosis. Critical Value/emergent results (Fournier's gangrene) were called by telephone at the time of interpretation on 11/26/2022 at 2:21 pm to provider [facility] , who verbally acknowledged these results. Electronically Signed   By: Allegra Lai M.D.   On: 11/26/2022 14:47    I reviewed all nursing notes, pharmacy notes, vitals, pertinent old records.  Assessment/Plan Principal Problem:   Fournier's gangrene in male   Fournier's gangrene in male CT abdomen pelvis reviewed as above S/p Zosyn given in the ED Started meropenem 1 g IV 3 times daily Continue as needed medication for pain control Follow blood cultures Follow-up tissue cultures from OR Follow hemoglobin A1c Surgery consulted, plan for debridement in the OR today Keep patient n.p.o. for now, resume diet after OR when cleared by general surgery   AKI, creatinine 2.06 admission Baseline creatinine 1.4 few years ago Follow  bladder scan, rule out urinary retention Continue IV fluid for hydration, monitor intake and output Avoid nephrotoxic medication, use renally dose medications  Hypokalemia,  monitor potassium level daily Replete as needed  Hypertension Low blood pressure due to Fournier's gangrene S/p IV fluid bolus given in the ED Continue IV fluid hydration Held home medications, patient was on amlodipine, lisinopril, hydrochlorothiazide Monitor BP and titrate medication accordingly Use hydralazine if needed  Obesity, BMI 48.  Daily exercise and calorie restricted diet advised to lose body weight.  Lifestyle modification discussed. Follow TSH level and hemoglobin A1c  Nutrition: NPO for now DVT Prophylaxis: Subcutaneous Lovenox  Advance goals of care discussion: Full code   Consults: General Surgery   Family Communication: family was not present at bedside, at the time of interview.  Opportunity was given to ask question and all questions were answered satisfactorily.  Disposition: Admitted as inpatient, telemetry unit. Likely to be discharged home, in 2-3 days, when cleared by general surgery.  I have discussed plan of care as described above  with RN and patient/family.  Severity of Illness: The appropriate patient status for this patient is INPATIENT. Inpatient status is judged to be reasonable and necessary in order to provide the required intensity of service to ensure the patient's safety. The patient's presenting symptoms, physical exam findings, and initial radiographic and laboratory data in the context of their chronic comorbidities is felt to place them at high risk for further clinical deterioration. Furthermore, it is not anticipated that the patient will be medically stable for discharge from the hospital within 2 midnights of admission.   * I certify that at the point of admission it is my clinical judgment that the patient will require inpatient hospital care spanning beyond 2  midnights from the point of admission due to high intensity of service, high risk for further deterioration and high frequency of surveillance required.*   Author: Gillis Santa, MD [facility] 11/26/2022 3:21 PM   To reach On-call, see care teams to locate the attending and reach out to them via [email or web address]. If 7PM-7AM, please contact night-coverage If you still have difficulty reaching the attending provider, please page the [facility] (Director on Call) for [facility] on amion for assistance.

## 2022-11-26 NOTE — Anesthesia Preprocedure Evaluation (Addendum)
Anesthesia Evaluation  Patient identified by MRN, date of birth, ID band Patient awake    Reviewed: Allergy & Precautions, H&P , NPO status , Patient's Chart, lab work & pertinent test results  Airway Mallampati: III  TM Distance: >3 FB Neck ROM: Full    Dental  (+) Dental Advisory Given, Teeth Intact   Pulmonary sleep apnea , Current Smoker and Patient abstained from smoking.   Pulmonary exam normal        Cardiovascular hypertension (112/71 preop), Pt. on medications Normal cardiovascular exam     Neuro/Psych  Headaches  negative psych ROS   GI/Hepatic negative GI ROS, Neg liver ROS,,,  Endo/Other    Morbid obesityBMI 48  Renal/GU Renal InsufficiencyRenal diseaseCr 2     Musculoskeletal negative musculoskeletal ROS (+)    Abdominal  (+) + obese  Peds negative pediatric ROS (+)  Hematology negative hematology ROS (+) Hb 13   Anesthesia Other Findings fournier's gangrene  Reproductive/Obstetrics                              Anesthesia Physical Anesthesia Plan  ASA: 3  Anesthesia Plan: General   Post-op Pain Management: Ofirmev IV (intra-op)*   Induction: Intravenous  PONV Risk Score and Plan: 1 and Ondansetron, Midazolam, Treatment may vary due to age or medical condition and Dexamethasone  Airway Management Planned: Oral ETT  Additional Equipment: None  Intra-op Plan:   Post-operative Plan: Extubation in OR  Informed Consent: I have reviewed the patients History and Physical, chart, labs and discussed the procedure including the risks, benefits and alternatives for the proposed anesthesia with the patient or authorized representative who has indicated his/her understanding and acceptance.     Dental advisory given  Plan Discussed with: CRNA  Anesthesia Plan Comments:         Anesthesia Quick Evaluation

## 2022-11-26 NOTE — Progress Notes (Signed)
Day of Surgery  Subjective: Patient was seen and examined in pre-op.  He is endorsing gluteal/perirectal pain. Denies new complaints.   ROS: See above, otherwise other systems negative  Objective: Vital signs in last 24 hours: Temp:  [98 F (36.7 C)-98.4 F (36.9 C)] 98 F (36.7 C) (10/08 1705) Pulse Rate:  [87-98] 90 (10/08 1705) Resp:  [16-18] 16 (10/08 1705) BP: (101-112)/(59-72) 111/59 (10/08 1705) SpO2:  [98 %-100 %] 98 % (10/08 1705) Weight:  [154.2 kg] 154.2 kg (10/08 0823)    Intake/Output from previous day: No intake/output data recorded. Intake/Output this shift: Total I/O In: 100 [IV Piggyback:100] Out: 100 [Urine:100]  PE: Gen: male resting in bed, NAD Resp: equal chest rise CV: RRR Abd: soft, non-distended Groin: swelling and induration of the perineum with extension along the scrotum and left gluteus, no crepitus, no drainage  Lab Results:  Recent Labs    11/26/22 0830  WBC 22.2*  HGB 13.0  HCT 39.8  PLT 186   BMET Recent Labs    11/26/22 0830  NA 136  K 3.4*  CL 102  CO2 23  GLUCOSE 100*  BUN 16  CREATININE 2.06*  CALCIUM 9.2   PT/INR No results for input(s): "LABPROT", "INR" in the last 72 hours. CMP     Component Value Date/Time   NA 136 11/26/2022 0830   K 3.4 (L) 11/26/2022 0830   CL 102 11/26/2022 0830   CO2 23 11/26/2022 0830   GLUCOSE 100 (H) 11/26/2022 0830   BUN 16 11/26/2022 0830   CREATININE 2.06 (H) 11/26/2022 0830   CREATININE 1.07 02/12/2017 1101   CALCIUM 9.2 11/26/2022 0830   PROT 6.8 11/26/2022 0830   ALBUMIN 2.9 (L) 11/26/2022 0830   AST 23 11/26/2022 0830   ALT 24 11/26/2022 0830   ALKPHOS 72 11/26/2022 0830   BILITOT 0.7 11/26/2022 0830   GFRNONAA 38 (L) 11/26/2022 0830   GFRAA >60 03/17/2018 1515   Lipase  No results found for: "LIPASE"  Studies/Results: CT ABDOMEN PELVIS W CONTRAST  Result Date: 11/26/2022 CLINICAL DATA:  Abdominal pain, concern for Fournier's gangrene EXAM: CT ABDOMEN AND  PELVIS WITH CONTRAST TECHNIQUE: Multidetector CT imaging of the abdomen and pelvis was performed using the standard protocol following bolus administration of intravenous contrast. RADIATION DOSE REDUCTION: This exam was performed according to the departmental dose-optimization program which includes automated exposure control, adjustment of the mA and/or kV according to patient size and/or use of iterative reconstruction technique. CONTRAST:  60mL OMNIPAQUE IOHEXOL 350 MG/ML SOLN COMPARISON:  CT abdomen and pelvis dated August 17, 2020 FINDINGS: Lower chest: No acute abnormality. Hepatobiliary: Hepatic steatosis. No focal liver abnormality is seen. No gallstones, gallbladder wall thickening, or biliary dilatation. Pancreas: Unremarkable. No pancreatic ductal dilatation or surrounding inflammatory changes. Spleen: Normal in size without focal abnormality. Adrenals/Urinary Tract: Stable small nodule of the right adrenal gland measuring 7 mm, likely an adenoma. No hydronephrosis or nephrolithiasis. Small low-attenuation lesion of the lower pole of the left kidney, likely a simple cysts but too small to accurately characterize, no specific follow-up imaging is necessary. Stomach/Bowel: Stomach is within normal limits. Appendix appears normal. No evidence of bowel wall thickening, distention, or inflammatory changes. Vascular/Lymphatic: No significant vascular findings are present. Enlarged left inguinal lymph nodes. Reference node measuring 12 mm in short axis on series 2, image 85. Reproductive: Prostate is unremarkable. Other: Soft tissue gas of the left scrotum and left mid thigh. No intra-abdominal free air or free fluid. Small  fat containing left inguinal hernia. 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 Evidence of prior umbilical hernia repair.  Musculoskeletal: No acute or significant osseous findings. IMPRESSION: 1. Soft tissue gas of the left scrotum and left mid thigh, findings are consistent with Fournier's gangrene. 2. Enlarged left inguinal lymph nodes, likely reactive. 3. 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 4. Hepatic steatosis. Critical Value/emergent results (Fournier's gangrene) were called by telephone at the time of interpretation on 11/26/2022 at 2:21 pm to provider [facility] , who verbally acknowledged these results. Electronically Signed   By: Allegra Lai M.D.   On: 11/26/2022 14:47    Anti-infectives: Anti-infectives (From admission, onward)    Start     Dose/Rate Route Frequency Ordered Stop   11/26/22 1515  [MAR Hold]  meropenem (MERREM) 1 g in sodium chloride 0.9 % 100 mL IVPB        (MAR Hold since Tue 11/26/2022 at 1634.Hold Reason: Transfer to a Procedural area)   1 g 200 mL/hr over 30 Minutes Intravenous Every 8 hours 11/26/22 1500     11/26/22 1145  piperacillin-tazobactam (ZOSYN) IVPB 3.375 g        3.375 g 100 mL/hr over 30 Minutes Intravenous  Once 11/26/22 1139 11/26/22 1311       Assessment/Plan 52 y/o M presenting with presumed Fournier's gangrene    - Will proceed urgently to OR for debridement of all infected tissue.  We discussed the natural history of this type of infection and the need for urgent surgical debridement.  There does not appear to be involvement of the rectum or intra-abdominal structures, but the full extent of the infection will not be known until after we begin the surgery.  All of his questions were addressed and consent was obtained  I reviewed last 24 h vitals and pain scores, last 24 h labs and trends, and last 24 h imaging results.   LOS: 0 days   [facility] 11/26/2022, 5:40 PM Please see Amion  for pager number during day hours 7:00am-4:30pm or 7:00am -11:30am on weekends

## 2022-11-27 ENCOUNTER — Encounter (HOSPITAL_COMMUNITY): Payer: Self-pay | Admitting: General Surgery

## 2022-11-27 DIAGNOSIS — N493 Fournier gangrene: Secondary | ICD-10-CM | POA: Diagnosis not present

## 2022-11-27 LAB — PHOSPHORUS: Phosphorus: 4 mg/dL (ref 2.5–4.6)

## 2022-11-27 LAB — CBC
HCT: 34 % — ABNORMAL LOW (ref 39.0–52.0)
Hemoglobin: 11.2 g/dL — ABNORMAL LOW (ref 13.0–17.0)
MCH: 28.6 pg (ref 26.0–34.0)
MCHC: 32.9 g/dL (ref 30.0–36.0)
MCV: 86.7 fL (ref 80.0–100.0)
Platelets: 156 10*3/uL (ref 150–400)
RBC: 3.92 MIL/uL — ABNORMAL LOW (ref 4.22–5.81)
RDW: 14.8 % (ref 11.5–15.5)
WBC: 22.8 10*3/uL — ABNORMAL HIGH (ref 4.0–10.5)
nRBC: 0 % (ref 0.0–0.2)

## 2022-11-27 LAB — MAGNESIUM: Magnesium: 1.9 mg/dL (ref 1.7–2.4)

## 2022-11-27 LAB — BASIC METABOLIC PANEL
Anion gap: 11 (ref 5–15)
BUN: 27 mg/dL — ABNORMAL HIGH (ref 6–20)
CO2: 22 mmol/L (ref 22–32)
Calcium: 8.5 mg/dL — ABNORMAL LOW (ref 8.9–10.3)
Chloride: 104 mmol/L (ref 98–111)
Creatinine, Ser: 3.27 mg/dL — ABNORMAL HIGH (ref 0.61–1.24)
GFR, Estimated: 22 mL/min — ABNORMAL LOW (ref >60)
Glucose, Bld: 119 mg/dL — ABNORMAL HIGH (ref 70–99)
Potassium: 3.9 mmol/L (ref 3.5–5.1)
Sodium: 137 mmol/L (ref 135–145)

## 2022-11-27 MED ORDER — PIPERACILLIN-TAZOBACTAM 3.375 G IVPB
3.3750 g | Freq: Three times a day (TID) | INTRAVENOUS | Status: DC
  Administered 2022-11-27 – 2022-11-29 (×6): 3.375 g via INTRAVENOUS
  Filled 2022-11-27 (×6): qty 50

## 2022-11-27 MED ORDER — HYDRALAZINE HCL 25 MG PO TABS: 25.0000 mg | ORAL_TABLET | Freq: Four times a day (QID) | ORAL | Status: DC | PRN

## 2022-11-27 NOTE — Progress Notes (Signed)
PROGRESS NOTE    Hatcher Frith Liddy  ION:[id number] DOB: 11-22-1970 DOA: 11/26/2022 PCP: Leilani Able, MD    Brief Narrative:   Parker Clay is a 52 y.o. male with past medical history significant for HTN, migraine headache, left eyelid ptosis, morbid obesity who presented to [facility] ED on 10/8 complaining of fever and perennial abscess.  Symptoms onset since Friday, went to urgent care day prior to admission and was advised to come to the ED for further evaluation.  Tmax reported of 103.0 F.  Endorses he took 1 dose of oral antibiotics which she had at home.  Given progressive symptoms, persistent fever and pain sought further evaluation in the ED.  Denies chest pain, no palpitations, no shortness of breath, no abdominal pain.  In the ED, temperature 98.4 F, HR 98, RR 16, BP 110/72, SpO2 100% on room air.  WBC 22.2, hemoglobin 13.0, platelet count 186.  Sodium 136, potassium 3.4, chloride 102, CO2 23, glucose 100, BUN 16, creatinine 2.06.  AST 23, ALT 24, total bilirubin 0.7.  Lactic acid 0.9.  TSH 0.602.  Hemoglobin A1c 6.0.  CT abdomen/pelvis with contrast with soft tissue gas left scrotum and left mid thigh consistent with Fournier's gangrene, enlarged left inguinal lymph nodes likely reactive, mesenteric soft tissue nodule located adjacent to the proximal duodenum unchanged when compared to prior exams, hepatic steatosis.  General surgery and urology were consulted.  TRH consulted for admission for further evaluation and management of Fournier's gangrene.   Assessment & Plan:   Fournier's gangrene Patient presenting to ED with fever, progressive pain to his perennial region with Tmax 103.0 outpatient.  Patient reports taking 1 dose of oral antibiotics before presenting to urgent care then to [facility] ED.  On arrival patient was afebrile with elevated WBC count of 22.2.  CT abdomen/pelvis with contrast with soft tissue gas left scrotum and left mid thigh consistent with Fournier's gangrene.  Patient was  started on empiric antibiotics.  General surgery and urology were consulted and patient underwent scrotal exploration with incision and drainage, excision of soft tissue infection of the perineum by urology and general surgery Dr. Alvester Morin and Metzger respectively on 11/26/2022. --General Surgery following, appreciate assistance -- WBC 22.2>22.8 -- Blood cultures x 2: No growth less than 24 hours -- Operative culture with few GPC's, GNR's, GPR's, no growth less than 12 hours -- Operative acid-fast culture: Pending -- Continue Zosyn 3.375 g IV every 8 hours -- WTD wound care/dressing changes once daily -- CBC daily  Essential hypertension --Continue to hold home amlodipine 10 mg p.o. daily and lisinopril-HCTZ 20-12.5 mg p.o. daily for now -- Hydralazine 25mg  PO q6h PRN SBP >165 or DBP >110 -- monitor BP closely   Acute renal failure on CKD stage II Last creatinine 1.4-1 28 2020.  No hydronephrosis noted on CT abdomen/pelvis.  Bladder scan today with no significant residual following void. -- Cr 2.06>3.27 -- Holding home lisinopril/hydrochlorothiazide -- Continue to encourage increased oral intake -- BMP in a.m.  Morbid obesity Body mass index is 48.1 kg/m.  Complicates all facets of care.  Prediabetes Hemoglobin A1c 6.0.  Outpatient follow with PCP   DVT prophylaxis: Lovenox    Code Status: Full Code Family Communication: No family present at bedside this morning  Disposition Plan:  Level of care: Telemetry Medical Status is: Inpatient Remains inpatient appropriate because: IV Novox, awaiting further recommendations from specialist    Consultants:  General surgery Urology  Procedures:  scrotal exploration with incision and drainage,  excision of soft tissue infection of the perineum by urology and general surgery Dr. Alvester Morin and Hillery Hunter; 11/26/2022  Antimicrobials:  Zosyn 10/8>> Meropenem 10/8 - 10/8   Subjective: Patient seen examined bedside, resting calmly.  Lying in  bed.  NT present obtaining bladder scan.  No specific complaints this morning.  Reports just voided.  No residual noted on bladder scan this morning.  Seen by general surgery, maintains on IV antibiotics given persistent leukocytosis.  Pain controlled.  Dressing just changed by RN.  No other specific complaints or concerns at this time.  Denies headache, no dizziness, no chest pain, no palpitations, no shortness of breath, no abdominal pain, no fever/chills/night sweats, no nausea/vomiting/diarrhea, no focal weakness, no fatigue, no paresthesia.  No acute events overnight per nurse staff.  Objective: Vitals:   11/26/22 2052 11/26/22 2354 11/27/22 0457 11/27/22 1016  BP: (!) 87/54 (!) 96/54 99/65 100/60  Pulse: 94 94 92 99  Resp: 18 18 18 20   Temp: 98.3 F (36.8 C) 98 F (36.7 C) 98.6 F (37 C) 98.3 F (36.8 C)  TempSrc: Oral Oral  Oral  SpO2: 100% 98% 99% 100%  Weight:      Height:        Intake/Output Summary (Last 24 hours) at 11/27/2022 1630 Last data filed at 11/27/2022 1552 Gross per 24 hour  Intake 1922.81 ml  Output --  Net 1922.81 ml   Filed Weights   11/26/22 0823  Weight: (!) 154.2 kg    Examination:  Physical Exam: GEN: NAD, alert and oriented x 3, obese HEENT: NCAT, PERRL, EOMI, sclera clear, MMM PULM: CTAB w/o wheezes/crackles, normal respiratory effort, on room air CV: RRR w/o M/G/R GI: abd soft, NTND, NABS, no R/G/M GU: Left groin wound noted with dressing in place, clean/dry/intact MSK: no peripheral edema, muscle strength globally intact 5/5 bilateral upper/lower extremities NEURO: CN II-XII intact, no focal deficits, sensation to light touch intact PSYCH: normal mood/affect Integumentary: Groin wound as above, otherwise no other concerning rashes/lesions/wounds noted on exposed skin surfaces     Data Reviewed: I have personally reviewed following labs and imaging studies  CBC: Recent Labs  Lab 11/26/22 0830 11/27/22 0552  WBC 22.2* 22.8*   NEUTROABS 20.6*  --   HGB 13.0 11.2*  HCT 39.8 34.0*  MCV 86.5 86.7  PLT 186 156   Basic Metabolic Panel: Recent Labs  Lab 11/26/22 0830 11/27/22 0552  NA 136 137  K 3.4* 3.9  CL 102 104  CO2 23 22  GLUCOSE 100* 119*  BUN 16 27*  CREATININE 2.06* 3.27*  CALCIUM 9.2 8.5*  MG  --  1.9  PHOS  --  4.0   GFR: Estimated Creatinine Clearance: 39.2 mL/min (A) (by C-G formula based on SCr of 3.27 mg/dL (H)). Liver Function Tests: Recent Labs  Lab 11/26/22 0830  AST 23  ALT 24  ALKPHOS 72  BILITOT 0.7  PROT 6.8  ALBUMIN 2.9*   No results for input(s): "LIPASE", "AMYLASE" in the last 168 hours. No results for input(s): "AMMONIA" in the last 168 hours. Coagulation Profile: No results for input(s): "INR", "PROTIME" in the last 168 hours. Cardiac Enzymes: No results for input(s): "CKTOTAL", "CKMB", "CKMBINDEX", "TROPONINI" in the last 168 hours. BNP (last 3 results) No results for input(s): "PROBNP" in the last 8760 hours. HbA1C: Recent Labs    11/26/22 0830  HGBA1C 6.0*   CBG: No results for input(s): "GLUCAP" in the last 168 hours. Lipid Profile: No results for input(s): "CHOL", "  HDL", "LDLCALC", "TRIG", "CHOLHDL", "LDLDIRECT" in the last 72 hours. Thyroid Function Tests: Recent Labs    11/26/22 0830  TSH 0.602   Anemia Panel: No results for input(s): "VITAMINB12", "FOLATE", "FERRITIN", "TIBC", "IRON", "RETICCTPCT" in the last 72 hours. Sepsis Labs: Recent Labs  Lab 11/26/22 0840  LATICACIDVEN 0.9    Recent Results (from the past 240 hour(s))  Culture, blood (routine x 2)     Status: None (Preliminary result)   Collection Time: 11/26/22 11:50 AM   Specimen: BLOOD LEFT FOREARM  Result Value Ref Range Status   Specimen Description BLOOD LEFT FOREARM  Final   Special Requests   Final    BOTTLES DRAWN AEROBIC AND ANAEROBIC Blood Culture results may not be optimal due to an inadequate volume of blood received in culture bottles   Culture   Final    NO  GROWTH < 24 HOURS Performed at [facility], [address] [place], [place] [address]    Report Status PENDING  Incomplete  Culture, blood (routine x 2)     Status: None (Preliminary result)   Collection Time: 11/26/22 11:50 AM   Specimen: BLOOD  Result Value Ref Range Status   Specimen Description BLOOD LEFT ANTECUBITAL  Final   Special Requests   Final    BOTTLES DRAWN AEROBIC AND ANAEROBIC Blood Culture adequate volume   Culture   Final    NO GROWTH < 24 HOURS Performed at [facility], [address], [place], [place] [address]    Report Status PENDING  Incomplete  Aerobic/Anaerobic Culture w Gram Stain (surgical/deep wound)     Status: None (Preliminary result)   Collection Time: 11/26/22  6:46 PM   Specimen: Path fluid; Body Fluid  Result Value Ref Range Status   Specimen Description ABSCESS PERINEAL  Final   Special Requests PT ON ZOSYN  Final   Gram Stain   Final    FEW WBC PRESENT, PREDOMINANTLY PMN FEW GRAM POSITIVE COCCI FEW GRAM NEGATIVE RODS FEW GRAM POSITIVE RODS    Culture   Final    NO GROWTH < 12 HOURS Performed at [facility], [address], [place], [place] [address]    Report Status PENDING  Incomplete         Radiology Studies: CT ABDOMEN PELVIS W CONTRAST  Result Date: 11/26/2022 CLINICAL DATA:  Abdominal pain, concern for Fournier's gangrene EXAM: CT ABDOMEN AND PELVIS WITH CONTRAST TECHNIQUE: Multidetector CT imaging of the abdomen and pelvis was performed using the standard protocol following bolus administration of intravenous contrast. RADIATION DOSE REDUCTION: This exam was performed according to the departmental dose-optimization program which includes automated exposure control, adjustment of the mA and/or kV according to patient size and/or use of iterative reconstruction technique. CONTRAST:  60mL OMNIPAQUE IOHEXOL 350 MG/ML SOLN COMPARISON:  CT abdomen and pelvis dated August 17, 2020 FINDINGS: Lower chest: No acute  abnormality. Hepatobiliary: Hepatic steatosis. No focal liver abnormality is seen. No gallstones, gallbladder wall thickening, or biliary dilatation. Pancreas: Unremarkable. No pancreatic ductal dilatation or surrounding inflammatory changes. Spleen: Normal in size without focal abnormality. Adrenals/Urinary Tract: Stable small nodule of the right adrenal gland measuring 7 mm, likely an adenoma. No hydronephrosis or nephrolithiasis. Small low-attenuation lesion of the lower pole of the left kidney, likely a simple cysts but too small to accurately characterize, no specific follow-up imaging is necessary. Stomach/Bowel: Stomach is within normal limits. Appendix appears normal. No evidence of bowel wall thickening, distention, or inflammatory changes.  Vascular/Lymphatic: No significant vascular findings are present. Enlarged left inguinal lymph nodes. Reference node measuring 12 mm in short axis on series 2, image 85. Reproductive: Prostate is unremarkable. Other: Soft tissue gas of the left scrotum and left mid thigh. No intra-abdominal free air or free fluid. Small fat containing left inguinal hernia. 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 Evidence of prior umbilical hernia repair. Musculoskeletal: No acute or significant osseous findings. IMPRESSION: 1. Soft tissue gas of the left scrotum and left mid thigh, findings are consistent with Fournier's gangrene. 2. Enlarged left inguinal lymph nodes, likely reactive. 3. 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 4. Hepatic steatosis. Critical Value/emergent results (Fournier's gangrene) were called by telephone at the time of interpretation on 11/26/2022 at 2:21 pm to  provider [facility] , who verbally acknowledged these results. Electronically Signed   By: Allegra Lai M.D.   On: 11/26/2022 14:47        Scheduled Meds:  enoxaparin (LOVENOX) injection  75 mg Subcutaneous Q24H   pantoprazole  40 mg Oral Daily   sodium chloride flush  3 mL Intravenous Q12H   Continuous Infusions:  piperacillin-tazobactam (ZOSYN)  IV 12.5 mL/hr at 11/27/22 1552     LOS: 1 day    Time spent: 53 minutes spent on chart review, discussion with nursing staff, consultants, updating family and interview/physical exam; more than 50% of that time was spent in counseling and/or coordination of care.    Alvira Philips [place], DO [facility] Available via Epic secure chat 7am-7pm After these hours, please refer to coverage provider listed on [email or web address] 11/27/2022, 4:30 PM

## 2022-11-27 NOTE — Anesthesia Postprocedure Evaluation (Addendum)
Anesthesia Post Note  Patient: Parker Clay R Kroenke  Procedure(s) Performed: IRRIGATION AND DEBRIDEMENT AND EXAMINE UNDER ANESTHESIA (Perineum)     Patient location during evaluation: PACU Anesthesia Type: General Level of consciousness: awake and alert Pain management: pain level controlled Vital Signs Assessment: post-procedure vital signs reviewed and stable Respiratory status: spontaneous breathing, nonlabored ventilation and respiratory function stable Cardiovascular status: blood pressure returned to baseline and stable Postop Assessment: no apparent nausea or vomiting Anesthetic complications: yes   Encounter Notable Events  Notable Event Outcome Phase Comment  Difficult to intubate - expected  Intraprocedure Filed from anesthesia note documentation.             Kongmeng Santoro

## 2022-11-27 NOTE — Progress Notes (Signed)
1 Day Post-Op   Subjective/Chief Complaint: Patient comfortable.  Dressing was changed this morning by nurses.   Objective: Vital signs in last 24 hours: Temp:  [98 F (36.7 C)-98.6 F (37 C)] 98.3 F (36.8 C) (10/09 1016) Pulse Rate:  [87-99] 99 (10/09 1016) Resp:  [11-20] 20 (10/09 1016) BP: (87-112)/(54-71) 100/60 (10/09 1016) SpO2:  [89 %-100 %] 100 % (10/09 1016)    Intake/Output from previous day: 10/08 0701 - 10/09 0700 In: 1430 [P.O.:580; I.V.:200; IV Piggyback:650] Out: 100 [Urine:100] Intake/Output this shift: Total I/O In: 480 [P.O.:480] Out: -   General appearance: alert and cooperative Resp: clear to auscultation bilaterally Cardio: Normal sinus rhythm Incision/Wound: Dressing in place.  It was just changed therefore was not changed again.  No cellulitis or crepitance on examination to the left perineum.  Lab Results:  Recent Labs    11/26/22 0830 11/27/22 0552  WBC 22.2* 22.8*  HGB 13.0 11.2*  HCT 39.8 34.0*  PLT 186 156   BMET Recent Labs    11/26/22 0830 11/27/22 0552  NA 136 137  K 3.4* 3.9  CL 102 104  CO2 23 22  GLUCOSE 100* 119*  BUN 16 27*  CREATININE 2.06* 3.27*  CALCIUM 9.2 8.5*   PT/INR No results for input(s): "LABPROT", "INR" in the last 72 hours. ABG No results for input(s): "PHART", "HCO3" in the last 72 hours.  Invalid input(s): "PCO2", "PO2"  Studies/Results: CT ABDOMEN PELVIS W CONTRAST  Result Date: 11/26/2022 CLINICAL DATA:  Abdominal pain, concern for Fournier's gangrene EXAM: CT ABDOMEN AND PELVIS WITH CONTRAST TECHNIQUE: Multidetector CT imaging of the abdomen and pelvis was performed using the standard protocol following bolus administration of intravenous contrast. RADIATION DOSE REDUCTION: This exam was performed according to the departmental dose-optimization program which includes automated exposure control, adjustment of the mA and/or kV according to patient size and/or use of iterative reconstruction  technique. CONTRAST:  60mL OMNIPAQUE IOHEXOL 350 MG/ML SOLN COMPARISON:  CT abdomen and pelvis dated August 17, 2020 FINDINGS: Lower chest: No acute abnormality. Hepatobiliary: Hepatic steatosis. No focal liver abnormality is seen. No gallstones, gallbladder wall thickening, or biliary dilatation. Pancreas: Unremarkable. No pancreatic ductal dilatation or surrounding inflammatory changes. Spleen: Normal in size without focal abnormality. Adrenals/Urinary Tract: Stable small nodule of the right adrenal gland measuring 7 mm, likely an adenoma. No hydronephrosis or nephrolithiasis. Small low-attenuation lesion of the lower pole of the left kidney, likely a simple cysts but too small to accurately characterize, no specific follow-up imaging is necessary. Stomach/Bowel: Stomach is within normal limits. Appendix appears normal. No evidence of bowel wall thickening, distention, or inflammatory changes. Vascular/Lymphatic: No significant vascular findings are present. Enlarged left inguinal lymph nodes. Reference node measuring 12 mm in short axis on series 2, image 85. Reproductive: Prostate is unremarkable. Other: Soft tissue gas of the left scrotum and left mid thigh. No intra-abdominal free air or free fluid. Small fat containing left inguinal hernia. 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 Evidence of prior umbilical hernia repair. Musculoskeletal: No acute or significant osseous findings. IMPRESSION: 1. Soft tissue gas of the left scrotum and left mid thigh, findings are consistent with Fournier's gangrene. 2. Enlarged left inguinal lymph nodes, likely reactive. 3. 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 4. Hepatic  steatosis. Critical Value/emergent results (Fournier's gangrene) were called by telephone at the time of interpretation on 11/26/2022 at 2:21 pm to provider [facility] , who verbally acknowledged these results. Electronically Signed   By: Allegra Lai M.D.   On: 11/26/2022 14:47    Anti-infectives: Anti-infectives (From admission, onward)    Start     Dose/Rate Route Frequency Ordered Stop   11/26/22 1515  meropenem (MERREM) 1 g in sodium chloride 0.9 % 100 mL IVPB        1 g 200 mL/hr over 30 Minutes Intravenous Every 8 hours 11/26/22 1500     11/26/22 1145  piperacillin-tazobactam (ZOSYN) IVPB 3.375 g        3.375 g 100 mL/hr over 30 Minutes Intravenous  Once 11/26/22 1139 11/26/22 1311       Assessment/Plan: s/p Procedure(s): IRRIGATION AND DEBRIDEMENT AND EXAMINE UNDER ANESTHESIA (N/A)   Doing well.  Elevated rwhite count therefore we will continue IV antibiotics.  Saline lock IV fluids  Continue wound care    LOS: 1 day    Aero Drummonds A Omarion Minnehan md  11/27/2022

## 2022-11-27 NOTE — TOC Initial Note (Addendum)
Transition of Care (TOC) - Initial/Assessment Note   Spoke to patient at bedside.   PCP Dr Leilani Able   Patient from home alone. Confirmed face sheet information.   Patient states his daughter watched the dressing change yesterday. His significant other and daughter will assist him at home. Patient asking for added help. NCM explained HHRN only visits maybe once a week a couple times for wound check , not daily to change the dressing. Patient voiced understanding.   Kelly with [facility] checking to see if she can accept referral.   Do not see wound care orders entered   Will need home health orders once wound care determined for home  Patient Details  Name: Parker Clay MRN: [id number] Date of Birth: [birth year]  Transition of Care [facility]) CM/SW Contact:    Kingsley Plan, RN Phone Number: 11/27/2022, 1:00 PM  Clinical Narrative:                   Expected Discharge Plan: Home w Home Health Services Barriers to Discharge: Continued Medical Work up   Patient Goals and CMS Choice Patient states their goals for this hospitalization and ongoing recovery are:: to return to home CMS Medicare.gov Compare Post Acute Care list provided to:: Patient Choice offered to / list presented to : Patient      Expected Discharge Plan and Services   Discharge Planning Services: CM Consult   Living arrangements for the past 2 months: Single Family Home                 DME Arranged: N/A         HH Arranged: RN          Prior Living Arrangements/Services Living arrangements for the past 2 months: Single Family Home Lives with:: Self Patient language and need for interpreter reviewed:: Yes        Need for Family Participation in Patient Care: Yes (Comment) Care giver support system in place?: Yes (comment)   Criminal Activity/Legal Involvement Pertinent to Current Situation/Hospitalization: No - Comment as needed  Activities of Daily Living   ADL Screening (condition  at time of admission) Independently performs ADLs?: Yes (appropriate for developmental age) Is the patient deaf or have difficulty hearing?: No Does the patient have difficulty seeing, even when wearing glasses/contacts?: No Does the patient have difficulty concentrating, remembering, or making decisions?: No  Permission Sought/Granted   Permission granted to share information with : No              Emotional Assessment Appearance:: Appears stated age Attitude/Demeanor/Rapport: Engaged Affect (typically observed): Accepting Orientation: : Oriented to Self, Oriented to Place, Oriented to  Time, Oriented to Situation Alcohol / Substance Use: Not Applicable Psych Involvement: No (comment)  Admission diagnosis:  Fournier's gangrene in male [N49.3] Patient Active Problem List   Diagnosis Date Noted   Fournier's gangrene in male 11/26/2022   Family history of colon cancer in father 02/12/2017   Migraine variant with headache 02/12/2017   Ptosis of eyelid, left 02/12/2017   Testicular atrophy 05/03/2014   Hypertension 04/02/2011   Obesity, Class III, BMI 40-49.9 (morbid obesity) (HCC) 04/02/2011   PCP:  Leilani Able, MD Pharmacy:   [id number] [place], [place] - [address] AT [facility] [address] [place] [address] Phone: [phone] Fax: [phone]     Social Determinants of Health (SDOH) Social History: SDOH Screenings   Food Insecurity: No  Food Insecurity (11/26/2022)  Housing: Low Risk  (11/26/2022)  Transportation Needs: No Transportation Needs (11/26/2022)  Utilities: Not At Risk (11/26/2022)  Tobacco Use: High Risk (11/26/2022)   SDOH Interventions:     Readmission Risk Interventions     No data to display

## 2022-11-27 NOTE — Plan of Care (Signed)
  Problem: Education: Goal: Knowledge of General Education information will improve Description: Including pain rating scale, medication(s)/side effects and non-pharmacologic comfort measures Outcome: Progressing   Problem: Health Behavior/Discharge Planning: Goal: Ability to manage health-related needs will improve Outcome: Progressing   Problem: Nutrition: Goal: Adequate nutrition will be maintained Outcome: Progressing   Problem: Coping: Goal: Level of anxiety will decrease Outcome: Progressing   Problem: Elimination: Goal: Will not experience complications related to urinary retention Outcome: Progressing   Problem: Pain Managment: Goal: General experience of comfort will improve Outcome: Progressing   

## 2022-11-28 DIAGNOSIS — N493 Fournier gangrene: Secondary | ICD-10-CM | POA: Diagnosis not present

## 2022-11-28 LAB — CBC
HCT: 34.3 % — ABNORMAL LOW (ref 39.0–52.0)
Hemoglobin: 11.4 g/dL — ABNORMAL LOW (ref 13.0–17.0)
MCH: 29.2 pg (ref 26.0–34.0)
MCHC: 33.2 g/dL (ref 30.0–36.0)
MCV: 87.7 fL (ref 80.0–100.0)
Platelets: 163 10*3/uL (ref 150–400)
RBC: 3.91 MIL/uL — ABNORMAL LOW (ref 4.22–5.81)
RDW: 15 % (ref 11.5–15.5)
WBC: 15.9 10*3/uL — ABNORMAL HIGH (ref 4.0–10.5)
nRBC: 0 % (ref 0.0–0.2)

## 2022-11-28 LAB — BASIC METABOLIC PANEL
Anion gap: 12 (ref 5–15)
BUN: 31 mg/dL — ABNORMAL HIGH (ref 6–20)
CO2: 20 mmol/L — ABNORMAL LOW (ref 22–32)
Calcium: 9.2 mg/dL (ref 8.9–10.3)
Chloride: 106 mmol/L (ref 98–111)
Creatinine, Ser: 2.33 mg/dL — ABNORMAL HIGH (ref 0.61–1.24)
GFR, Estimated: 33 mL/min — ABNORMAL LOW (ref >60)
Glucose, Bld: 104 mg/dL — ABNORMAL HIGH (ref 70–99)
Potassium: 3.9 mmol/L (ref 3.5–5.1)
Sodium: 138 mmol/L (ref 135–145)

## 2022-11-28 LAB — ACID FAST SMEAR (AFB, MYCOBACTERIA): Acid Fast Smear: NEGATIVE

## 2022-11-28 MED ORDER — OXYCODONE HCL 5 MG PO TABS
5.0000 mg | ORAL_TABLET | ORAL | Status: DC | PRN
  Administered 2022-11-28 – 2022-11-29 (×4): 5 mg via ORAL
  Filled 2022-11-28 (×4): qty 1

## 2022-11-28 NOTE — Plan of Care (Signed)
  Problem: Education: Goal: Knowledge of General Education information will improve Description: Including pain rating scale, medication(s)/side effects and non-pharmacologic comfort measures Outcome: Progressing   Problem: Health Behavior/Discharge Planning: Goal: Ability to manage health-related needs will improve Outcome: Progressing   Problem: Clinical Measurements: Goal: Will remain free from infection Outcome: Progressing Goal: Cardiovascular complication will be avoided Outcome: Progressing   Problem: Activity: Goal: Risk for activity intolerance will decrease Outcome: Progressing   Problem: Coping: Goal: Level of anxiety will decrease Outcome: Progressing   Problem: Pain Managment: Goal: General experience of comfort will improve Outcome: Progressing   Problem: Skin Integrity: Goal: Risk for impaired skin integrity will decrease Outcome: Progressing

## 2022-11-28 NOTE — Progress Notes (Signed)
Progress Note  2 Days Post-Op  Subjective: Pt reports they have been changing packing in the evening. His daughter and girlfriend will be helping when [facility] is not changing. Pain is better than prior to OR but still having some sensitivity overall.   Objective: Vital signs in last 24 hours: Temp:  [97.4 F (36.3 C)-98.7 F (37.1 C)] 97.5 F (36.4 C) (10/10 0855) Pulse Rate:  [79-99] 79 (10/10 0855) Resp:  [18] 18 (10/10 0855) BP: (113-130)/(66-84) 130/84 (10/10 0855) SpO2:  [98 %-100 %] 100 % (10/10 0855)    Intake/Output from previous day: 10/09 0701 - 10/10 0700 In: 1639.9 [P.O.:1440; IV Piggyback:199.9] Out: 200 [Urine:200] Intake/Output this shift: No intake/output data recorded.  PE: General: pleasant, WD, obese male who is laying in bed in NAD Heart: regular, rate, and rhythm. Lungs: Respiratory effort nonlabored Abd: soft, NT, ND GU: left groin and perineal wound with packing in place, surrounding erythema and induration improving Psych: A&Ox3 with an appropriate affect.    Lab Results:  Recent Labs    11/27/22 0552 11/28/22 0808  WBC 22.8* 15.9*  HGB 11.2* 11.4*  HCT 34.0* 34.3*  PLT 156 163   BMET Recent Labs    11/27/22 0552 11/28/22 0808  NA 137 138  K 3.9 3.9  CL 104 106  CO2 22 20*  GLUCOSE 119* 104*  BUN 27* 31*  CREATININE 3.27* 2.33*  CALCIUM 8.5* 9.2   PT/INR No results for input(s): "LABPROT", "INR" in the last 72 hours. CMP     Component Value Date/Time   NA 138 11/28/2022 0808   K 3.9 11/28/2022 0808   CL 106 11/28/2022 0808   CO2 20 (L) 11/28/2022 0808   GLUCOSE 104 (H) 11/28/2022 0808   BUN 31 (H) 11/28/2022 0808   CREATININE 2.33 (H) 11/28/2022 0808   CREATININE 1.07 02/12/2017 1101   CALCIUM 9.2 11/28/2022 0808   PROT 6.8 11/26/2022 0830   ALBUMIN 2.9 (L) 11/26/2022 0830   AST 23 11/26/2022 0830   ALT 24 11/26/2022 0830   ALKPHOS 72 11/26/2022 0830   BILITOT 0.7 11/26/2022 0830   GFRNONAA 33 (L) 11/28/2022 0808    GFRAA >60 03/17/2018 1515   Lipase  No results found for: "LIPASE"     Studies/Results: CT ABDOMEN PELVIS W CONTRAST  Result Date: 11/26/2022 CLINICAL DATA:  Abdominal pain, concern for Fournier's gangrene EXAM: CT ABDOMEN AND PELVIS WITH CONTRAST TECHNIQUE: Multidetector CT imaging of the abdomen and pelvis was performed using the standard protocol following bolus administration of intravenous contrast. RADIATION DOSE REDUCTION: This exam was performed according to the departmental dose-optimization program which includes automated exposure control, adjustment of the mA and/or kV according to patient size and/or use of iterative reconstruction technique. CONTRAST:  60mL OMNIPAQUE IOHEXOL 350 MG/ML SOLN COMPARISON:  CT abdomen and pelvis dated August 17, 2020 FINDINGS: Lower chest: No acute abnormality. Hepatobiliary: Hepatic steatosis. No focal liver abnormality is seen. No gallstones, gallbladder wall thickening, or biliary dilatation. Pancreas: Unremarkable. No pancreatic ductal dilatation or surrounding inflammatory changes. Spleen: Normal in size without focal abnormality. Adrenals/Urinary Tract: Stable small nodule of the right adrenal gland measuring 7 mm, likely an adenoma. No hydronephrosis or nephrolithiasis. Small low-attenuation lesion of the lower pole of the left kidney, likely a simple cysts but too small to accurately characterize, no specific follow-up imaging is necessary. Stomach/Bowel: Stomach is within normal limits. Appendix appears normal. No evidence of bowel wall thickening, distention, or inflammatory changes. Vascular/Lymphatic: No significant vascular findings are  present. Enlarged left inguinal lymph nodes. Reference node measuring 12 mm in short axis on series 2, image 85. Reproductive: Prostate is unremarkable. Other: Soft tissue gas of the left scrotum and left mid thigh. No intra-abdominal free air or free fluid. Small fat containing left inguinal hernia. 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 Evidence of prior umbilical hernia repair. Musculoskeletal: No acute or significant osseous findings. IMPRESSION: 1. Soft tissue gas of the left scrotum and left mid thigh, findings are consistent with Fournier's gangrene. 2. Enlarged left inguinal lymph nodes, likely reactive. 3. 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 4. Hepatic steatosis. Critical Value/emergent results (Fournier's gangrene) were called by telephone at the time of interpretation on 11/26/2022 at 2:21 pm to provider [facility] , who verbally acknowledged these results. Electronically Signed   By: Allegra Lai M.D.   On: 11/26/2022 14:47    Anti-infectives: Anti-infectives (From admission, onward)    Start     Dose/Rate Route Frequency Ordered Stop   11/27/22 1530  piperacillin-tazobactam (ZOSYN) IVPB 3.375 g        3.375 g 12.5 mL/hr over 240 Minutes Intravenous Every 8 hours 11/27/22 1436     11/26/22 1515  meropenem (MERREM) 1 g in sodium chloride 0.9 % 100 mL IVPB  Status:  Discontinued        1 g 200 mL/hr over 30 Minutes Intravenous Every 8 hours 11/26/22 1500 11/27/22 1436   11/26/22 1145  piperacillin-tazobactam (ZOSYN) IVPB 3.375 g        3.375 g 100 mL/hr over 30 Minutes Intravenous  Once 11/26/22 1139 11/26/22 1311        Assessment/Plan  Fournier's gangrene  POD2 s/p I&D Dr. Hillery Hunter - afebrile, WBC down to 15 - Cxs reincubated for better growth - continue daily dressing changes - ok to shower prior to dressing change to help loosen packing - likely ready for DC tomorrow from surgery standpoint, HH orders placed today    FEN - CM diet VTE - SCD's, LMWH ID - Zosyn  - per  TRH -  HTN Morbid obesity - BMI 48 ARF on CKD stage II Prediabetes   LOS: 2 days     Juliet Rude, [facility] 11/28/2022, 10:41 AM Please see Amion for pager number during day hours 7:00am-4:30pm

## 2022-11-28 NOTE — Progress Notes (Signed)
   11/28/22 1323  Mobility  Activity Ambulated independently in hallway  Level of Assistance Independent  Assistive Device Other (Comment) (Iv Pole)  Distance Ambulated (ft) 1150 ft  Activity Response Tolerated well  Mobility Referral Yes  $Mobility charge 1 Mobility  Mobility Specialist Start Time (ACUTE ONLY) 1305  Mobility Specialist Stop Time (ACUTE ONLY) 1322  Mobility Specialist Time Calculation (min) (ACUTE ONLY) 17 min   Mobility Specialist: Progress Note  Pt agreeable to mobility session - received in bed. Ambulated independently pushing the IV Pole. Pt was asymptomatic throughout session, stated he was concerned about open wounds when going home. Returned to standing in room with all needs met - call bell within reach.   Barnie Mort, BS Mobility Specialist Please contact via SecureChat or Rehab office at [phone].

## 2022-11-28 NOTE — Discharge Instructions (Signed)
WOUND CARE: - dressing to be changed 1-2x daily (change more often if increased drainage) - supplies: sterile saline, kerlix/guaze, scissors, ABD pads, tape  - remove dressing and all packing carefully, moistening with sterile saline as needed to avoid packing/internal dressing sticking to the wound. - clean edges of skin around the wound with water/gauze, making sure there is no tape debris or leakage left on skin that could cause skin irritation or breakdown. - dampen clean kerlix/gauze with sterile saline and pack wound from wound base to skin level, making sure to take note of any possible areas of wound tracking, tunneling and packing appropriately. Wound can be packed loosely. Trim kerlix/gauze to size if a whole roll/piece is not required. - cover wound with a dry ABD pad and secure with tape.  - write the date/time on the dry dressing/tape to better track when the last dressing change occurred. - apply any skin protectant/powder recommended by clinician to protect skin/skin folds. - change dressing as needed if leakage occurs, wound gets contaminated, or patient requests to shower. - patient may shower daily with wound open and following the shower the wound should be dried and a clean dressing placed.

## 2022-11-28 NOTE — Plan of Care (Signed)
  Problem: Education: Goal: Knowledge of General Education information will improve Description: Including pain rating scale, medication(s)/side effects and non-pharmacologic comfort measures Outcome: Progressing   Problem: Health Behavior/Discharge Planning: Goal: Ability to manage health-related needs will improve Outcome: Progressing   Problem: Clinical Measurements: Goal: Ability to maintain clinical measurements within normal limits will improve Outcome: Progressing Goal: Will remain free from infection Outcome: Progressing Goal: Cardiovascular complication will be avoided Outcome: Progressing   Problem: Activity: Goal: Risk for activity intolerance will decrease Outcome: Progressing   Problem: Nutrition: Goal: Adequate nutrition will be maintained Outcome: Progressing   Problem: Coping: Goal: Level of anxiety will decrease Outcome: Progressing   Problem: Pain Managment: Goal: General experience of comfort will improve Outcome: Progressing   Problem: Skin Integrity: Goal: Risk for impaired skin integrity will decrease Outcome: Progressing

## 2022-11-28 NOTE — Progress Notes (Signed)
PROGRESS NOTE    Parker Clay  GGY:[id number] DOB: 1971/02/04 DOA: 11/26/2022 PCP: Leilani Able, MD    Brief Narrative:   Parker Clay is a 52 y.o. male with past medical history significant for HTN, migraine headache, left eyelid ptosis, morbid obesity who presented to [facility] ED on 10/8 complaining of fever and perennial abscess.  Symptoms onset since Friday, went to urgent care day prior to admission and was advised to come to the ED for further evaluation.  Tmax reported of 103.0 F.  Endorses he took 1 dose of oral antibiotics which she had at home.  Given progressive symptoms, persistent fever and pain sought further evaluation in the ED.  Denies chest pain, no palpitations, no shortness of breath, no abdominal pain.  In the ED, temperature 98.4 F, HR 98, RR 16, BP 110/72, SpO2 100% on room air.  WBC 22.2, hemoglobin 13.0, platelet count 186.  Sodium 136, potassium 3.4, chloride 102, CO2 23, glucose 100, BUN 16, creatinine 2.06.  AST 23, ALT 24, total bilirubin 0.7.  Lactic acid 0.9.  TSH 0.602.  Hemoglobin A1c 6.0.  CT abdomen/pelvis with contrast with soft tissue gas left scrotum and left mid thigh consistent with Fournier's gangrene, enlarged left inguinal lymph nodes likely reactive, mesenteric soft tissue nodule located adjacent to the proximal duodenum unchanged when compared to prior exams, hepatic steatosis.  General surgery and urology were consulted.  TRH consulted for admission for further evaluation and management of Fournier's gangrene.   Assessment & Plan:   Fournier's gangrene Patient presenting to ED with fever, progressive pain to his perennial region with Tmax 103.0 outpatient.  Patient reports taking 1 dose of oral antibiotics before presenting to urgent care then to [facility] ED.  On arrival patient was afebrile with elevated WBC count of 22.2.  CT abdomen/pelvis with contrast with soft tissue gas left scrotum and left mid thigh consistent with Fournier's gangrene.  Patient was  started on empiric antibiotics.  General surgery and urology were consulted and patient underwent scrotal exploration with incision and drainage, excision of soft tissue infection of the perineum by urology and general surgery Dr. Alvester Morin and Metzger respectively on 11/26/2022. --General Surgery following, appreciate assistance -- WBC 22.2>22.8>15.9 -- Blood cultures x 2: No growth less than 24 hours -- Operative culture with few GPC's, GNR's, GPR's, further pending -- Operative acid-fast culture: Pending -- Continue Zosyn 3.375 g IV every 8 hours -- WTD wound care/dressing changes once daily -- CBC daily  Essential hypertension --Continue to hold home amlodipine 10 mg p.o. daily and lisinopril-HCTZ 20-12.5 mg p.o. daily for now -- Hydralazine 25mg  PO q6h PRN SBP >165 or DBP >110 -- monitor BP closely   Acute renal failure on CKD stage II Last creatinine 1.4-1 28 2020.  No hydronephrosis noted on CT abdomen/pelvis.  Bladder scan today with no significant residual following void. -- Cr 2.06>3.27>2.33 -- Holding home lisinopril/hydrochlorothiazide -- Continue to encourage increased oral intake -- BMP in a.m.  Morbid obesity Body mass index is 48.1 kg/m.  Complicates all facets of care.  Prediabetes Hemoglobin A1c 6.0.  Outpatient follow with PCP   DVT prophylaxis: Lovenox    Code Status: Full Code Family Communication: No family present at bedside this morning  Disposition Plan:  Level of care: Telemetry Medical Status is: Inpatient Remains inpatient appropriate because: IV Novox, awaiting further recommendations from specialist    Consultants:  General surgery Urology  Procedures:  scrotal exploration with incision and drainage, excision of soft tissue  infection of the perineum by urology and general surgery Dr. Alvester Morin and Hillery Hunter; 11/26/2022  Antimicrobials:  Zosyn 10/8>> Meropenem 10/8 - 10/8   Subjective: Patient seen examined bedside, resting calmly.  Lying in bed.   Dressing changed last night.  RN reports patient's family received teaching overnight.  Discussed with general surgery this morning.  Remains on IV Novox, WBC count now down to 15.9.  Pain controlled.  No other specific complaints or concerns at this time.  Denies headache, no dizziness, no chest pain, no palpitations, no shortness of breath, no abdominal pain, no fever/chills/night sweats, no nausea/vomiting/diarrhea, no focal weakness, no fatigue, no paresthesia.  No acute events overnight per nursing staff.  Objective: Vitals:   11/27/22 1647 11/27/22 2122 11/28/22 0616 11/28/22 0855  BP: 113/66 118/70 126/77 130/84  Pulse: 99 99 84 79  Resp: 18 18 18 18   Temp: 98 F (36.7 C) 98.7 F (37.1 C) (!) 97.4 F (36.3 C) (!) 97.5 F (36.4 C)  TempSrc: Oral Oral Oral Oral  SpO2: 100% 98% 99% 100%  Weight:      Height:        Intake/Output Summary (Last 24 hours) at 11/28/2022 1204 Last data filed at 11/28/2022 0135 Gross per 24 hour  Intake 919.94 ml  Output 200 ml  Net 719.94 ml   Filed Weights   11/26/22 0823  Weight: (!) 154.2 kg    Examination:  Physical Exam: GEN: NAD, alert and oriented x 3, obese HEENT: NCAT, PERRL, EOMI, sclera clear, MMM PULM: CTAB w/o wheezes/crackles, normal respiratory effort, on room air CV: RRR w/o M/G/R GI: abd soft, NTND, NABS, no R/G/M GU: Left groin wound noted with dressing in place, clean/dry/intact MSK: no peripheral edema, muscle strength globally intact 5/5 bilateral upper/lower extremities NEURO: CN II-XII intact, no focal deficits, sensation to light touch intact PSYCH: normal mood/affect Integumentary: Groin wound as above, otherwise no other concerning rashes/lesions/wounds noted on exposed skin surfaces     Data Reviewed: I have personally reviewed following labs and imaging studies  CBC: Recent Labs  Lab 11/26/22 0830 11/27/22 0552 11/28/22 0808  WBC 22.2* 22.8* 15.9*  NEUTROABS 20.6*  --   --   HGB 13.0 11.2* 11.4*   HCT 39.8 34.0* 34.3*  MCV 86.5 86.7 87.7  PLT 186 156 163   Basic Metabolic Panel: Recent Labs  Lab 11/26/22 0830 11/27/22 0552 11/28/22 0808  NA 136 137 138  K 3.4* 3.9 3.9  CL 102 104 106  CO2 23 22 20*  GLUCOSE 100* 119* 104*  BUN 16 27* 31*  CREATININE 2.06* 3.27* 2.33*  CALCIUM 9.2 8.5* 9.2  MG  --  1.9  --   PHOS  --  4.0  --    GFR: Estimated Creatinine Clearance: 55.1 mL/min (A) (by C-G formula based on SCr of 2.33 mg/dL (H)). Liver Function Tests: Recent Labs  Lab 11/26/22 0830  AST 23  ALT 24  ALKPHOS 72  BILITOT 0.7  PROT 6.8  ALBUMIN 2.9*   No results for input(s): "LIPASE", "AMYLASE" in the last 168 hours. No results for input(s): "AMMONIA" in the last 168 hours. Coagulation Profile: No results for input(s): "INR", "PROTIME" in the last 168 hours. Cardiac Enzymes: No results for input(s): "CKTOTAL", "CKMB", "CKMBINDEX", "TROPONINI" in the last 168 hours. BNP (last 3 results) No results for input(s): "PROBNP" in the last 8760 hours. HbA1C: Recent Labs    11/26/22 0830  HGBA1C 6.0*   CBG: No results for input(s): "GLUCAP" in  the last 168 hours. Lipid Profile: No results for input(s): "CHOL", "HDL", "LDLCALC", "TRIG", "CHOLHDL", "LDLDIRECT" in the last 72 hours. Thyroid Function Tests: Recent Labs    11/26/22 0830  TSH 0.602   Anemia Panel: No results for input(s): "VITAMINB12", "FOLATE", "FERRITIN", "TIBC", "IRON", "RETICCTPCT" in the last 72 hours. Sepsis Labs: Recent Labs  Lab 11/26/22 0840  LATICACIDVEN 0.9    Recent Results (from the past 240 hour(s))  Culture, blood (routine x 2)     Status: None (Preliminary result)   Collection Time: 11/26/22 11:50 AM   Specimen: BLOOD LEFT FOREARM  Result Value Ref Range Status   Specimen Description BLOOD LEFT FOREARM  Final   Special Requests   Final    BOTTLES DRAWN AEROBIC AND ANAEROBIC Blood Culture results may not be optimal due to an inadequate volume of blood received in culture  bottles   Culture   Final    NO GROWTH < 24 HOURS Performed at [facility], [address], [place], [place] [address]    Report Status PENDING  Incomplete  Culture, blood (routine x 2)     Status: None (Preliminary result)   Collection Time: 11/26/22 11:50 AM   Specimen: BLOOD  Result Value Ref Range Status   Specimen Description BLOOD LEFT ANTECUBITAL  Final   Special Requests   Final    BOTTLES DRAWN AEROBIC AND ANAEROBIC Blood Culture adequate volume   Culture   Final    NO GROWTH < 24 HOURS Performed at [facility], [address] [place], [place] [address]    Report Status PENDING  Incomplete  Aerobic/Anaerobic Culture w Gram Stain (surgical/deep wound)     Status: None (Preliminary result)   Collection Time: 11/26/22  6:46 PM   Specimen: Path fluid; Body Fluid  Result Value Ref Range Status   Specimen Description ABSCESS PERINEAL  Final   Special Requests PT ON ZOSYN  Final   Gram Stain   Final    FEW WBC PRESENT, PREDOMINANTLY PMN FEW GRAM POSITIVE COCCI FEW GRAM NEGATIVE RODS FEW GRAM POSITIVE RODS    Culture   Final    CULTURE REINCUBATED FOR BETTER GROWTH HOLDING FOR POSSIBLE ANAEROBE Performed at [facility], [address] [place], [place] [address]    Report Status PENDING  Incomplete         Radiology Studies: CT ABDOMEN PELVIS W CONTRAST  Result Date: 11/26/2022 CLINICAL DATA:  Abdominal pain, concern for Fournier's gangrene EXAM: CT ABDOMEN AND PELVIS WITH CONTRAST TECHNIQUE: Multidetector CT imaging of the abdomen and pelvis was performed using the standard protocol following bolus administration of intravenous contrast. RADIATION DOSE REDUCTION: This exam was performed according to the departmental dose-optimization program which includes automated exposure control, adjustment of the mA and/or kV according to patient size and/or use of iterative reconstruction technique. CONTRAST:  60mL OMNIPAQUE IOHEXOL 350 MG/ML SOLN COMPARISON:   CT abdomen and pelvis dated August 17, 2020 FINDINGS: Lower chest: No acute abnormality. Hepatobiliary: Hepatic steatosis. No focal liver abnormality is seen. No gallstones, gallbladder wall thickening, or biliary dilatation. Pancreas: Unremarkable. No pancreatic ductal dilatation or surrounding inflammatory changes. Spleen: Normal in size without focal abnormality. Adrenals/Urinary Tract: Stable small nodule of the right adrenal gland measuring 7 mm, likely an adenoma. No hydronephrosis or nephrolithiasis. Small low-attenuation lesion of the lower pole of the left kidney, likely a simple cysts but too small to accurately characterize, no specific follow-up imaging is necessary. Stomach/Bowel: Stomach is within  normal limits. Appendix appears normal. No evidence of bowel wall thickening, distention, or inflammatory changes. Vascular/Lymphatic: No significant vascular findings are present. Enlarged left inguinal lymph nodes. Reference node measuring 12 mm in short axis on series 2, image 85. Reproductive: Prostate is unremarkable. Other: Soft tissue gas of the left scrotum and left mid thigh. No intra-abdominal free air or free fluid. Small fat containing left inguinal hernia. 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 Evidence of prior umbilical hernia repair. Musculoskeletal: No acute or significant osseous findings. IMPRESSION: 1. Soft tissue gas of the left scrotum and left mid thigh, findings are consistent with Fournier's gangrene. 2. Enlarged left inguinal lymph nodes, likely reactive. 3. 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 4. Hepatic steatosis. Critical Value/emergent results (Fournier's gangrene) were  called by telephone at the time of interpretation on 11/26/2022 at 2:21 pm to provider [facility] , who verbally acknowledged these results. Electronically Signed   By: Allegra Lai M.D.   On: 11/26/2022 14:47        Scheduled Meds:  enoxaparin (LOVENOX) injection  75 mg Subcutaneous Q24H   pantoprazole  40 mg Oral Daily   sodium chloride flush  3 mL Intravenous Q12H   Continuous Infusions:  piperacillin-tazobactam (ZOSYN)  IV 3.375 g (11/28/22 0616)     LOS: 2 days    Time spent: 53 minutes spent on chart review, discussion with nursing staff, consultants, updating family and interview/physical exam; more than 50% of that time was spent in counseling and/or coordination of care.    Alvira Philips [place], DO [facility] Available via Epic secure chat 7am-7pm After these hours, please refer to coverage provider listed on [email or web address] 11/28/2022, 12:04 PM

## 2022-11-29 DIAGNOSIS — N493 Fournier gangrene: Secondary | ICD-10-CM | POA: Diagnosis not present

## 2022-11-29 LAB — BASIC METABOLIC PANEL
Anion gap: 13 (ref 5–15)
BUN: 26 mg/dL — ABNORMAL HIGH (ref 6–20)
CO2: 22 mmol/L (ref 22–32)
Calcium: 9.3 mg/dL (ref 8.9–10.3)
Chloride: 106 mmol/L (ref 98–111)
Creatinine, Ser: 1.96 mg/dL — ABNORMAL HIGH (ref 0.61–1.24)
GFR, Estimated: 40 mL/min — ABNORMAL LOW (ref >60)
Glucose, Bld: 108 mg/dL — ABNORMAL HIGH (ref 70–99)
Potassium: 3.9 mmol/L (ref 3.5–5.1)
Sodium: 141 mmol/L (ref 135–145)

## 2022-11-29 LAB — CBC
HCT: 36.8 % — ABNORMAL LOW (ref 39.0–52.0)
Hemoglobin: 12.3 g/dL — ABNORMAL LOW (ref 13.0–17.0)
MCH: 29.3 pg (ref 26.0–34.0)
MCHC: 33.4 g/dL (ref 30.0–36.0)
MCV: 87.6 fL (ref 80.0–100.0)
Platelets: 183 10*3/uL (ref 150–400)
RBC: 4.2 MIL/uL — ABNORMAL LOW (ref 4.22–5.81)
RDW: 15.3 % (ref 11.5–15.5)
WBC: 11.2 10*3/uL — ABNORMAL HIGH (ref 4.0–10.5)
nRBC: 0 % (ref 0.0–0.2)

## 2022-11-29 MED ORDER — AMLODIPINE BESYLATE 10 MG PO TABS: 10.0000 mg | ORAL_TABLET | Freq: Every day | ORAL | 0 refills | Status: AC

## 2022-11-29 MED ORDER — AMOXICILLIN-POT CLAVULANATE 875-125 MG PO TABS: 1.0000 | ORAL_TABLET | Freq: Two times a day (BID) | ORAL | 0 refills | Status: AC

## 2022-11-29 MED ORDER — OXYCODONE HCL 5 MG PO TABS: 5.0000 mg | ORAL_TABLET | ORAL | 0 refills | Status: DC | PRN

## 2022-11-29 NOTE — Progress Notes (Signed)
   11/29/22 1214  Mobility  Activity Ambulated independently in hallway  Level of Assistance Independent  Assistive Device Other (Comment) (Iv Pole)  Distance Ambulated (ft) 1150 ft  Activity Response Tolerated well  Mobility Referral Yes  $Mobility charge 1 Mobility  Mobility Specialist Start Time (ACUTE ONLY) 1122  Mobility Specialist Stop Time (ACUTE ONLY) 1138  Mobility Specialist Time Calculation (min) (ACUTE ONLY) 16 min   Mobility Specialist: Progress Note  Pt agreeable to mobility session - received in chair. Ambulated Ind using Iv Pole. Pt was asymptomatic throughout session with no complaints. Returned to chair with all needs met - call bell within reach.   Barnie Mort, BS Mobility Specialist Please contact via SecureChat or Rehab office at [phone].

## 2022-11-29 NOTE — TOC Progression Note (Signed)
Transition of Care (TOC) - Progression Note   Updated Tresa Endo with [facility] on patient's discharge today  Patient Details  Name: Parker Clay MRN: [id number] Date of Birth: 06-Jan-1971  Transition of Care [facility]) CM/SW Contact  Solomiya Pascale, Adria Devon, RN Phone Number: 11/29/2022, 11:53 AM  Clinical Narrative:       Expected Discharge Plan: Home w Home Health Services Barriers to Discharge: Continued Medical Work up  Expected Discharge Plan and Services   Discharge Planning Services: CM Consult   Living arrangements for the past 2 months: Single Family Home Expected Discharge Date: 11/29/22               DME Arranged: N/A         HH Arranged: RN           Social Determinants of Health (SDOH) Interventions SDOH Screenings   Food Insecurity: No Food Insecurity (11/26/2022)  Housing: Low Risk  (11/26/2022)  Transportation Needs: No Transportation Needs (11/26/2022)  Utilities: Not At Risk (11/26/2022)  Tobacco Use: High Risk (11/26/2022)    Readmission Risk Interventions     No data to display

## 2022-11-29 NOTE — Progress Notes (Signed)
Progress Note  3 Days Post-Op  Subjective: No new complaints.  Dressing changes going well.  Showered yesterday  Objective: Vital signs in last 24 hours: Temp:  [97.6 F (36.4 C)-98.4 F (36.9 C)] 97.7 F (36.5 C) (10/11 0459) Pulse Rate:  [81-90] 86 (10/11 0459) Resp:  [18-19] 18 (10/11 0459) BP: (128-170)/(79-93) 138/93 (10/11 0459) SpO2:  [100 %] 100 % (10/11 0459)    Intake/Output from previous day: 10/10 0701 - 10/11 0700 In: 1194.9 [P.O.:1080; IV Piggyback:114.9] Out: -  Intake/Output this shift: No intake/output data recorded.  PE: General: pleasant, WD, obese male who is laying in bed in NAD Abd: soft, NT, ND GU: left groin and perineal wound with packing in place, surrounding erythema and induration improving.  Overall clean, but with some fibrinous exudate present Psych: A&Ox3 with an appropriate affect.    Lab Results:  Recent Labs    11/28/22 0808 11/29/22 0600  WBC 15.9* 11.2*  HGB 11.4* 12.3*  HCT 34.3* 36.8*  PLT 163 183   BMET Recent Labs    11/28/22 0808 11/29/22 0600  NA 138 141  K 3.9 3.9  CL 106 106  CO2 20* 22  GLUCOSE 104* 108*  BUN 31* 26*  CREATININE 2.33* 1.96*  CALCIUM 9.2 9.3   PT/INR No results for input(s): "LABPROT", "INR" in the last 72 hours. CMP     Component Value Date/Time   NA 141 11/29/2022 0600   K 3.9 11/29/2022 0600   CL 106 11/29/2022 0600   CO2 22 11/29/2022 0600   GLUCOSE 108 (H) 11/29/2022 0600   BUN 26 (H) 11/29/2022 0600   CREATININE 1.96 (H) 11/29/2022 0600   CREATININE 1.07 02/12/2017 1101   CALCIUM 9.3 11/29/2022 0600   PROT 6.8 11/26/2022 0830   ALBUMIN 2.9 (L) 11/26/2022 0830   AST 23 11/26/2022 0830   ALT 24 11/26/2022 0830   ALKPHOS 72 11/26/2022 0830   BILITOT 0.7 11/26/2022 0830   GFRNONAA 40 (L) 11/29/2022 0600   GFRAA >60 03/17/2018 1515   Lipase  No results found for: "LIPASE"     Studies/Results: No results found.  Anti-infectives: Anti-infectives (From admission,  onward)    Start     Dose/Rate Route Frequency Ordered Stop   11/27/22 1530  piperacillin-tazobactam (ZOSYN) IVPB 3.375 g        3.375 g 12.5 mL/hr over 240 Minutes Intravenous Every 8 hours 11/27/22 1436     11/26/22 1515  meropenem (MERREM) 1 g in sodium chloride 0.9 % 100 mL IVPB  Status:  Discontinued        1 g 200 mL/hr over 30 Minutes Intravenous Every 8 hours 11/26/22 1500 11/27/22 1436   11/26/22 1145  piperacillin-tazobactam (ZOSYN) IVPB 3.375 g        3.375 g 100 mL/hr over 30 Minutes Intravenous  Once 11/26/22 1139 11/26/22 1311        Assessment/Plan  Fournier's gangrene  POD 3, s/p I&D Dr. Hillery Hunter - afebrile, WBC down to 11 - Cxs reincubated for better growth, can give a total of 10 days of abx therapy. - continue daily dressing changes - ok to shower prior to dressing change to help loosen packing - ok for DC home today from our standpoint.  Follow up arranged.   FEN - CM diet VTE - SCD's, LMWH ID - Zosyn  - per TRH -  HTN Morbid obesity - BMI 48 ARF on CKD stage II Prediabetes   LOS: 3 days  Letha Cape, [facility] 11/29/2022, 9:07 AM Please see Amion for pager number during day hours 7:00am-4:30pm

## 2022-11-29 NOTE — Discharge Summary (Signed)
Physician Discharge Summary  Parker Clay UEA:[id number] DOB: 1970/04/12 DOA: 11/26/2022  PCP: Leilani Able, MD  Admit date: 11/26/2022 Discharge date: 11/29/2022  Admitted From: Home Disposition: Home  Recommendations for Outpatient Follow-up:  Follow up with PCP in 1-2 weeks Follow-up with general surgery, Dr. Hillery Hunter 1-2 weeks Continue Augmentin to complete antibiotic course for Fourniers Gangrene  Discontinued home lisinopril/HCTZ due to renal insufficiency Recommend repeat CBC/BMP 1 week  Home Health: RN Equipment/Devices: none  Discharge Condition: Stable CODE STATUS: Full code Diet recommendation: Heart healthy diet  History of present illness:  Parker Clay is a 52 y.o. male with past medical history significant for HTN, migraine headache, left eyelid ptosis, morbid obesity who presented to [facility] ED on 10/8 complaining of fever and perennial abscess.  Symptoms onset since Friday, went to urgent care day prior to admission and was advised to come to the ED for further evaluation.  Tmax reported of 103.0 F.  Endorses he took 1 dose of oral antibiotics which she had at home.  Given progressive symptoms, persistent fever and pain sought further evaluation in the ED.  Denies chest pain, no palpitations, no shortness of breath, no abdominal pain.   In the ED, temperature 98.4 F, HR 98, RR 16, BP 110/72, SpO2 100% on room air.  WBC 22.2, hemoglobin 13.0, platelet count 186.  Sodium 136, potassium 3.4, chloride 102, CO2 23, glucose 100, BUN 16, creatinine 2.06.  AST 23, ALT 24, total bilirubin 0.7.  Lactic acid 0.9.  TSH 0.602.  Hemoglobin A1c 6.0.  CT abdomen/pelvis with contrast with soft tissue gas left scrotum and left mid thigh consistent with Fournier's gangrene, enlarged left inguinal lymph nodes likely reactive, mesenteric soft tissue nodule located adjacent to the proximal duodenum unchanged when compared to prior exams, hepatic steatosis.  General surgery and urology were  consulted.  TRH consulted for admission for further evaluation and management of Fournier's gangrene.   Hospital course:  Fournier's gangrene Patient presenting to ED with fever, progressive pain to his perennial region with Tmax 103.0 outpatient.  Patient reports taking 1 dose of oral antibiotics before presenting to urgent care then to [facility] ED.  On arrival patient was afebrile with elevated WBC count of 22.2.  CT abdomen/pelvis with contrast with soft tissue gas left scrotum and left mid thigh consistent with Fournier's gangrene.  Patient was started on empiric antibiotics.  General surgery and urology were consulted and patient underwent scrotal exploration with incision and drainage, excision of soft tissue infection of the perineum by urology and general surgery Dr. Alvester Morin and Metzger respectively on 11/26/2022.  Patient was started on IV Zosyn with improvement of WBC count from a peak of 22.8-11.2 at time of discharge.  General surgery okay for discharge home with 10-day course of antibiotics, discharging on Augmentin.  Operative culture remains pending at time of discharge.  Continue dressing changes 1-2 times daily.  Outpatient follow-up with general surgery 1-2 weeks.  Recommend CBC 1 week.   Essential hypertension May resume home amlodipine amlodipine 10 mg p.o. daily on discharge.  Discontinue lisinopril/HCTZ for now given renal insufficiency.  Outpatient follow-up with PCP.  Recommend repeat BMP 1 week.  And lisinopril-HCTZ 20-12.5 mg p.o. daily for now   Acute renal failure on CKD stage II Last creatinine 1.4 in 2020.  No hydronephrosis noted on CT abdomen/pelvis.  Bladder scan today with no significant residual following void.  Creatinine peaked at 3.27 and improved to 1.96 at time of discharge.  Discontinue lisinopril/HCTZ  as above for now.  Recommend repeat BMP 1 week.   Morbid obesity Body mass index is 48.1 kg/m.  Complicates all facets of care.   Prediabetes Hemoglobin A1c 6.0.   Outpatient follow with PCP  Discharge Diagnoses:  Principal Problem:   Fournier's gangrene in male    Discharge Instructions  Discharge Instructions     Call MD for:  difficulty breathing, headache or visual disturbances   Complete by: As directed    Call MD for:  extreme fatigue   Complete by: As directed    Call MD for:  persistant dizziness or light-headedness   Complete by: As directed    Call MD for:  persistant nausea and vomiting   Complete by: As directed    Call MD for:  severe uncontrolled pain   Complete by: As directed    Call MD for:  temperature >100.4   Complete by: As directed    Diet - low sodium heart healthy   Complete by: As directed    Discharge wound care:   Complete by: As directed    WOUND CARE: - dressing to be changed 1-2x daily (change more often if increased drainage) - supplies: sterile saline, kerlix/guaze, scissors, ABD pads, tape  - remove dressing and all packing carefully, moistening with sterile saline as needed to avoid packing/internal dressing sticking to the wound. - clean edges of skin around the wound with water/gauze, making sure there is no tape debris or leakage left on skin that could cause skin irritation or breakdown. - dampen clean kerlix/gauze with sterile saline and pack wound from wound base to skin level, making sure to take note of any possible areas of wound tracking, tunneling and packing appropriately. Wound can be packed loosely. Trim kerlix/gauze to size if a whole roll/piece is not required. - cover wound with a dry ABD pad and secure with tape.  - write the date/time on the dry dressing/tape to better track when the last dressing change occurred. - apply any skin protectant/powder recommended by clinician to protect skin/skin folds. - change dressing as needed if leakage occurs, wound gets contaminated, or patient requests to shower. - patient may shower daily with wound open and following the shower the wound should be  dried and a clean dressing placed.   Increase activity slowly   Complete by: As directed       Allergies as of 11/29/2022   No Known Allergies      Medication List     STOP taking these medications    lisinopril-hydrochlorothiazide 20-12.5 MG tablet Commonly known as: ZESTORETIC   predniSONE 10 MG (21) Tbpk tablet Commonly known as: STERAPRED UNI-PAK 21 TAB       TAKE these medications    amLODipine 10 MG tablet Commonly known as: NORVASC Take 1 tablet (10 mg total) by mouth daily.   amoxicillin-clavulanate 875-125 MG tablet Commonly known as: AUGMENTIN Take 1 tablet by mouth 2 (two) times daily for 8 days.   oxyCODONE 5 MG immediate release tablet Commonly known as: Oxy IR/ROXICODONE Take 1 tablet (5 mg total) by mouth every 4 (four) hours as needed for moderate pain.   pantoprazole 40 MG tablet Commonly known as: PROTONIX Take 1 tablet (40 mg total) by mouth daily.   sildenafil 20 MG tablet Commonly known as: REVATIO Take 20 mg by mouth daily as needed (erectile dsyfunction).               Discharge Care Instructions  (From admission, onward)  Start     Ordered   11/29/22 0000  Discharge wound care:       Comments: WOUND CARE: - dressing to be changed 1-2x daily (change more often if increased drainage) - supplies: sterile saline, kerlix/guaze, scissors, ABD pads, tape  - remove dressing and all packing carefully, moistening with sterile saline as needed to avoid packing/internal dressing sticking to the wound. - clean edges of skin around the wound with water/gauze, making sure there is no tape debris or leakage left on skin that could cause skin irritation or breakdown. - dampen clean kerlix/gauze with sterile saline and pack wound from wound base to skin level, making sure to take note of any possible areas of wound tracking, tunneling and packing appropriately. Wound can be packed loosely. Trim kerlix/gauze to size if a whole  roll/piece is not required. - cover wound with a dry ABD pad and secure with tape.  - write the date/time on the dry dressing/tape to better track when the last dressing change occurred. - apply any skin protectant/powder recommended by clinician to protect skin/skin folds. - change dressing as needed if leakage occurs, wound gets contaminated, or patient requests to shower. - patient may shower daily with wound open and following the shower the wound should be dried and a clean dressing placed.   11/29/22 1055            Follow-up Information     [facility] Follow up.   Specialty: [facility] Contact information: [address] [place] [address] [phone]         Moise Boring, MD Follow up.   Specialty: General Surgery Contact information: [address] [place] [address] [phone]                No Known Allergies  Consultations: General surgery Urology   Procedures/Studies: CT ABDOMEN PELVIS W CONTRAST  Result Date: 11/26/2022 CLINICAL DATA:  Abdominal pain, concern for Fournier's gangrene EXAM: CT ABDOMEN AND PELVIS WITH CONTRAST TECHNIQUE: Multidetector CT imaging of the abdomen and pelvis was performed using the standard protocol following bolus administration of intravenous contrast. RADIATION DOSE REDUCTION: This exam was performed according to the departmental dose-optimization program which includes automated exposure control, adjustment of the mA and/or kV according to patient size and/or use of iterative reconstruction technique. CONTRAST:  60mL OMNIPAQUE IOHEXOL 350 MG/ML SOLN COMPARISON:  CT abdomen and pelvis dated August 17, 2020 FINDINGS: Lower chest: No acute abnormality. Hepatobiliary: Hepatic steatosis. No focal liver abnormality is seen. No gallstones, gallbladder wall thickening, or biliary dilatation. Pancreas: Unremarkable. No pancreatic ductal dilatation or surrounding inflammatory  changes. Spleen: Normal in size without focal abnormality. Adrenals/Urinary Tract: Stable small nodule of the right adrenal gland measuring 7 mm, likely an adenoma. No hydronephrosis or nephrolithiasis. Small low-attenuation lesion of the lower pole of the left kidney, likely a simple cysts but too small to accurately characterize, no specific follow-up imaging is necessary. Stomach/Bowel: Stomach is within normal limits. Appendix appears normal. No evidence of bowel wall thickening, distention, or inflammatory changes. Vascular/Lymphatic: No significant vascular findings are present. Enlarged left inguinal lymph nodes. Reference node measuring 12 mm in short axis on series 2, image 85. Reproductive: Prostate is unremarkable. Other: Soft tissue gas of the left scrotum and left mid thigh. No intra-abdominal free air or free fluid. Small fat containing left inguinal hernia. 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 Evidence of prior umbilical hernia repair. Musculoskeletal: No acute or significant osseous findings. IMPRESSION: 1. Soft tissue gas of the left scrotum and left mid thigh, findings are consistent with Fournier's gangrene. 2. Enlarged left inguinal lymph nodes, likely reactive. 3. 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 4. Hepatic steatosis. Critical Value/emergent results (Fournier's gangrene) were called by telephone at the time of interpretation on 11/26/2022 at 2:21 pm to provider [facility] , who verbally acknowledged these results. Electronically Signed   By: Allegra Lai M.D.   On: 11/26/2022 14:47     Subjective: Patient seen examined bedside, resting calmly.  Lying in bed.  No  specific complaints this morning.  Seen by general surgery and okay for discharge home with 10-day total course of antibiotics.  Patient with no other questions or concerns at this time.  Patient able to perform packing with assistance of family in which they were taught during hospitalization.  Denies headache, no visual changes, no chest pain, no palpitations, no shortness of breath, no abdominal pain, no fever/chills/night sweats, no nausea/vomiting/diarrhea, no focal weakness, no fatigue, no cough/congestion, no paresthesias.  No acute events overnight per nursing.  Discharge Exam: Vitals:   11/29/22 0459 11/29/22 0915  BP: (!) 138/93 (!) 140/93  Pulse: 86 69  Resp: 18 16  Temp: 97.7 F (36.5 C) 97.6 F (36.4 C)  SpO2: 100% 100%   Vitals:   11/28/22 1800 11/28/22 2043 11/29/22 0459 11/29/22 0915  BP: 128/79 (!) 170/91 (!) 138/93 (!) 140/93  Pulse: 81 90 86 69  Resp: 19 18 18 16   Temp: 97.6 F (36.4 C) 98.4 F (36.9 C) 97.7 F (36.5 C) 97.6 F (36.4 C)  TempSrc: Oral Oral Oral Oral  SpO2: 100% 100% 100% 100%  Weight:      Height:        Physical Exam: GEN: NAD, alert and oriented x 3, obese HEENT: NCAT, PERRL, EOMI, sclera clear, MMM PULM: CTAB w/o wheezes/crackles, normal respiratory effort, on room air CV: RRR w/o M/G/R GI: abd soft, NTND, NABS, no R/G/M GU: Left groin wound noted with dressing in place, clean/dry/intact MSK: no peripheral edema, muscle strength globally intact 5/5 bilateral upper/lower extremities NEURO: CN II-XII intact, no focal deficits, sensation to light touch intact PSYCH: normal mood/affect Integumentary: Left groin wound as above, otherwise no other concerning rashes/lesions/wounds noted on exposed skin surfaces.      The results of significant diagnostics from this hospitalization (including imaging, microbiology, ancillary and laboratory) are listed below for reference.     Microbiology: Recent Results (from the past 240 hour(s))   Culture, blood (routine x 2)     Status: None (Preliminary result)   Collection Time: 11/26/22 11:50 AM   Specimen: BLOOD LEFT FOREARM  Result Value Ref Range Status   Specimen Description BLOOD LEFT FOREARM  Final   Special Requests   Final    BOTTLES DRAWN AEROBIC AND ANAEROBIC Blood Culture results may not be optimal due to an inadequate volume of blood received in culture bottles   Culture   Final    NO GROWTH 2 DAYS Performed at [facility], [address], [place], [place] [address]    Report Status PENDING  Incomplete  Culture, blood (routine x 2)  Status: None (Preliminary result)   Collection Time: 11/26/22 11:50 AM   Specimen: BLOOD  Result Value Ref Range Status   Specimen Description BLOOD LEFT ANTECUBITAL  Final   Special Requests   Final    BOTTLES DRAWN AEROBIC AND ANAEROBIC Blood Culture adequate volume   Culture   Final    NO GROWTH 2 DAYS Performed at [facility], [address] [place], [place] [address]    Report Status PENDING  Incomplete  Aerobic/Anaerobic Culture w Gram Stain (surgical/deep wound)     Status: None (Preliminary result)   Collection Time: 11/26/22  6:46 PM   Specimen: Path fluid; Body Fluid  Result Value Ref Range Status   Specimen Description ABSCESS PERINEAL  Final   Special Requests PT ON ZOSYN  Final   Gram Stain   Final    FEW WBC PRESENT, PREDOMINANTLY PMN FEW GRAM POSITIVE COCCI FEW GRAM NEGATIVE RODS FEW GRAM POSITIVE RODS    Culture   Final    CULTURE REINCUBATED FOR BETTER GROWTH HOLDING FOR POSSIBLE ANAEROBE Performed at [facility], [address] [place], [place] [address]    Report Status PENDING  Incomplete  Acid Fast Smear (AFB)     Status: None   Collection Time: 11/26/22  6:46 PM   Specimen: Path fluid; Body Fluid  Result Value Ref Range Status   AFB Specimen Processing Concentration  Final   Acid Fast Smear Negative  Final    Comment: (NOTE) Performed At: [facility] [address] [place], [place] [id number] Jolene Schimke MD DG:[id number]    Source (AFB) ABSCESS  Final    Comment: PERINEAL Performed at [facility], [address] [place], [place] [address]      Labs: BNP (last 3 results) No results for input(s): "BNP" in the last 8760 hours. Basic Metabolic Panel: Recent Labs  Lab 11/26/22 0830 11/27/22 0552 11/28/22 0808 11/29/22 0600  NA 136 137 138 141  K 3.4* 3.9 3.9 3.9  CL 102 104 106 106  CO2 23 22 20* 22  GLUCOSE 100* 119* 104* 108*  BUN 16 27* 31* 26*  CREATININE 2.06* 3.27* 2.33* 1.96*  CALCIUM 9.2 8.5* 9.2 9.3  MG  --  1.9  --   --   PHOS  --  4.0  --   --    Liver Function Tests: Recent Labs  Lab 11/26/22 0830  AST 23  ALT 24  ALKPHOS 72  BILITOT 0.7  PROT 6.8  ALBUMIN 2.9*   No results for input(s): "LIPASE", "AMYLASE" in the last 168 hours. No results for input(s): "AMMONIA" in the last 168 hours. CBC: Recent Labs  Lab 11/26/22 0830 11/27/22 0552 11/28/22 0808 11/29/22 0600  WBC 22.2* 22.8* 15.9* 11.2*  NEUTROABS 20.6*  --   --   --   HGB 13.0 11.2* 11.4* 12.3*  HCT 39.8 34.0* 34.3* 36.8*  MCV 86.5 86.7 87.7 87.6  PLT 186 156 163 183   Cardiac Enzymes: No results for input(s): "CKTOTAL", "CKMB", "CKMBINDEX", "TROPONINI" in the last 168 hours. BNP: Invalid input(s): "POCBNP" CBG: No results for input(s): "GLUCAP" in the last 168 hours. D-Dimer No results for input(s): "DDIMER" in the last 72 hours. Hgb A1c No results for input(s): "HGBA1C" in the last 72 hours. Lipid Profile No results for input(s): "CHOL", "HDL", "LDLCALC", "TRIG", "CHOLHDL", "LDLDIRECT" in the last 72 hours. Thyroid function studies No results for input(s): "TSH", "T4TOTAL", "T3FREE", "THYROIDAB" in the last 72  hours.  Invalid input(s): "FREET3" Anemia work up No results for input(s): "VITAMINB12", "FOLATE", "FERRITIN", "TIBC", "IRON", "RETICCTPCT" in the last 72 hours. Urinalysis    Component Value Date/Time    BILIRUBINUR N 12/02/2012 1032   PROTEINUR N 12/02/2012 1032   UROBILINOGEN negative 12/02/2012 1032   NITRITE N 12/02/2012 1032   LEUKOCYTESUR Negative 12/02/2012 1032   Sepsis Labs Recent Labs  Lab 11/26/22 0830 11/27/22 0552 11/28/22 0808 11/29/22 0600  WBC 22.2* 22.8* 15.9* 11.2*   Microbiology Recent Results (from the past 240 hour(s))  Culture, blood (routine x 2)     Status: None (Preliminary result)   Collection Time: 11/26/22 11:50 AM   Specimen: BLOOD LEFT FOREARM  Result Value Ref Range Status   Specimen Description BLOOD LEFT FOREARM  Final   Special Requests   Final    BOTTLES DRAWN AEROBIC AND ANAEROBIC Blood Culture results may not be optimal due to an inadequate volume of blood received in culture bottles   Culture   Final    NO GROWTH 2 DAYS Performed at [facility], [address] [place], [place] [address]    Report Status PENDING  Incomplete  Culture, blood (routine x 2)     Status: None (Preliminary result)   Collection Time: 11/26/22 11:50 AM   Specimen: BLOOD  Result Value Ref Range Status   Specimen Description BLOOD LEFT ANTECUBITAL  Final   Special Requests   Final    BOTTLES DRAWN AEROBIC AND ANAEROBIC Blood Culture adequate volume   Culture   Final    NO GROWTH 2 DAYS Performed at [facility], [address], [place], [place] [address]    Report Status PENDING  Incomplete  Aerobic/Anaerobic Culture w Gram Stain (surgical/deep wound)     Status: None (Preliminary result)   Collection Time: 11/26/22  6:46 PM   Specimen: Path fluid; Body Fluid  Result Value Ref Range Status   Specimen Description ABSCESS PERINEAL  Final   Special Requests PT ON ZOSYN  Final   Gram Stain   Final    FEW WBC PRESENT, PREDOMINANTLY PMN FEW GRAM POSITIVE COCCI FEW GRAM NEGATIVE RODS FEW GRAM POSITIVE RODS    Culture   Final    CULTURE REINCUBATED FOR BETTER GROWTH HOLDING FOR POSSIBLE ANAEROBE Performed at [facility], [address] [place], [place] [address]    Report Status PENDING  Incomplete  Acid Fast Smear (AFB)     Status: None   Collection Time: 11/26/22  6:46 PM   Specimen: Path fluid; Body Fluid  Result Value Ref Range Status   AFB Specimen Processing Concentration  Final   Acid Fast Smear Negative  Final    Comment: (NOTE) Performed At: [facility] [address] [place], [place] [id number] Jolene Schimke MD UV:[id number]    Source (AFB) ABSCESS  Final    Comment: PERINEAL Performed at [facility], [address], [place], [place] [address]      Time coordinating discharge: Over 30 minutes  SIGNED:   Alvira Philips [place], DO  [facility] 11/29/2022, 10:55 AM

## 2022-12-01 LAB — AEROBIC/ANAEROBIC CULTURE W GRAM STAIN (SURGICAL/DEEP WOUND)

## 2022-12-01 LAB — CULTURE, BLOOD (ROUTINE X 2)
Culture: NO GROWTH
Culture: NO GROWTH
Special Requests: ADEQUATE

## 2023-01-11 LAB — ACID FAST CULTURE WITH REFLEXED SENSITIVITIES (MYCOBACTERIA): Acid Fast Culture: NEGATIVE

## 2023-01-14 ENCOUNTER — Ambulatory Visit (HOSPITAL_BASED_OUTPATIENT_CLINIC_OR_DEPARTMENT_OTHER): Payer: 59 | Admitting: General Surgery

## 2023-07-06 ENCOUNTER — Emergency Department (HOSPITAL_BASED_OUTPATIENT_CLINIC_OR_DEPARTMENT_OTHER)

## 2023-07-06 ENCOUNTER — Emergency Department (HOSPITAL_BASED_OUTPATIENT_CLINIC_OR_DEPARTMENT_OTHER)
Admission: EM | Admit: 2023-07-06 | Discharge: 2023-07-06 | Disposition: A | Discharge status: Home / Self Care | Source: Home / Self Care | Attending: Emergency Medicine | Admitting: Emergency Medicine

## 2023-07-06 ENCOUNTER — Encounter (HOSPITAL_BASED_OUTPATIENT_CLINIC_OR_DEPARTMENT_OTHER): Payer: Self-pay

## 2023-07-06 ENCOUNTER — Other Ambulatory Visit: Payer: Self-pay

## 2023-07-06 ENCOUNTER — Encounter (HOSPITAL_BASED_OUTPATIENT_CLINIC_OR_DEPARTMENT_OTHER): Payer: Self-pay | Admitting: Emergency Medicine

## 2023-07-06 ENCOUNTER — Emergency Department (HOSPITAL_BASED_OUTPATIENT_CLINIC_OR_DEPARTMENT_OTHER)
Admission: EM | Admit: 2023-07-06 | Discharge: 2023-07-06 | Disposition: A | Discharge status: Home / Self Care | Attending: Emergency Medicine | Admitting: Emergency Medicine

## 2023-07-06 DIAGNOSIS — R6883 Chills (without fever): Secondary | ICD-10-CM | POA: Insufficient documentation

## 2023-07-06 DIAGNOSIS — R7989 Other specified abnormal findings of blood chemistry: Secondary | ICD-10-CM | POA: Insufficient documentation

## 2023-07-06 DIAGNOSIS — I10 Essential (primary) hypertension: Secondary | ICD-10-CM | POA: Insufficient documentation

## 2023-07-06 DIAGNOSIS — Z720 Tobacco use: Secondary | ICD-10-CM | POA: Diagnosis not present

## 2023-07-06 DIAGNOSIS — K610 Anal abscess: Secondary | ICD-10-CM | POA: Insufficient documentation

## 2023-07-06 DIAGNOSIS — R258 Other abnormal involuntary movements: Secondary | ICD-10-CM | POA: Insufficient documentation

## 2023-07-06 DIAGNOSIS — Z79899 Other long term (current) drug therapy: Secondary | ICD-10-CM | POA: Insufficient documentation

## 2023-07-06 DIAGNOSIS — R000 Tachycardia, unspecified: Secondary | ICD-10-CM | POA: Insufficient documentation

## 2023-07-06 DIAGNOSIS — L0291 Cutaneous abscess, unspecified: Secondary | ICD-10-CM

## 2023-07-06 LAB — BASIC METABOLIC PANEL WITH GFR
Anion gap: 14 (ref 5–15)
BUN: 12 mg/dL (ref 6–20)
CO2: 23 mmol/L (ref 22–32)
Calcium: 9.7 mg/dL (ref 8.9–10.3)
Chloride: 105 mmol/L (ref 98–111)
Creatinine, Ser: 1.21 mg/dL (ref 0.61–1.24)
GFR, Estimated: >60 mL/min (ref >60)
Glucose, Bld: 90 mg/dL (ref 70–99)
Potassium: 3.9 mmol/L (ref 3.5–5.1)
Sodium: 141 mmol/L (ref 135–145)

## 2023-07-06 LAB — CBC WITH DIFFERENTIAL/PLATELET
Abs Immature Granulocytes: 0.03 10*3/uL (ref 0.00–0.07)
Basophils Absolute: 0 10*3/uL (ref 0.0–0.1)
Basophils Relative: 0 %
Eosinophils Absolute: 0 10*3/uL (ref 0.0–0.5)
Eosinophils Relative: 0 %
HCT: 41.8 % (ref 39.0–52.0)
Hemoglobin: 13.5 g/dL (ref 13.0–17.0)
Immature Granulocytes: 0 %
Lymphocytes Relative: 10 %
Lymphs Abs: 0.8 10*3/uL (ref 0.7–4.0)
MCH: 28.8 pg (ref 26.0–34.0)
MCHC: 32.3 g/dL (ref 30.0–36.0)
MCV: 89.3 fL (ref 80.0–100.0)
Monocytes Absolute: 0 10*3/uL — ABNORMAL LOW (ref 0.1–1.0)
Monocytes Relative: 0 %
Neutro Abs: 6.8 10*3/uL (ref 1.7–7.7)
Neutrophils Relative %: 90 %
Platelets: 165 10*3/uL (ref 150–400)
RBC: 4.68 MIL/uL (ref 4.22–5.81)
RDW: 15.1 % (ref 11.5–15.5)
WBC: 7.7 10*3/uL (ref 4.0–10.5)
nRBC: 0 % (ref 0.0–0.2)

## 2023-07-06 LAB — LACTIC ACID, PLASMA
Lactic Acid, Venous: 3.1 mmol/L (ref 0.5–1.9)
Lactic Acid, Venous: 2.4 mmol/L (ref 0.5–1.9)

## 2023-07-06 MED ORDER — CLINDAMYCIN PHOSPHATE 300 MG/50ML IV SOLN
300.0000 mg | Freq: Once | INTRAVENOUS | Status: AC
  Administered 2023-07-06: 300 mg via INTRAVENOUS
  Filled 2023-07-06: qty 50

## 2023-07-06 MED ORDER — SODIUM CHLORIDE 0.9 % IV SOLN: 3.0000 g | Freq: Once | INTRAVENOUS | Status: DC

## 2023-07-06 MED ORDER — SODIUM CHLORIDE 0.9 % IV BOLUS
1000.0000 mL | Freq: Once | INTRAVENOUS | Status: AC
  Administered 2023-07-06: 1000 mL via INTRAVENOUS

## 2023-07-06 MED ORDER — DOXYCYCLINE HYCLATE 100 MG PO CAPS: 100.0000 mg | ORAL_CAPSULE | Freq: Two times a day (BID) | ORAL | 0 refills | Status: AC

## 2023-07-06 MED ORDER — IOHEXOL 300 MG/ML  SOLN
100.0000 mL | Freq: Once | INTRAMUSCULAR | Status: AC | PRN
  Administered 2023-07-06: 100 mL via INTRAVENOUS

## 2023-07-06 MED ORDER — OXYCODONE HCL 5 MG PO TABS
5.0000 mg | ORAL_TABLET | Freq: Four times a day (QID) | ORAL | 0 refills | Status: AC | PRN
Start: 2023-07-06 — End: ?

## 2023-07-06 MED ORDER — LIDOCAINE-EPINEPHRINE (PF) 2 %-1:200000 IJ SOLN
10.0000 mL | Freq: Once | INTRAMUSCULAR | Status: AC
  Administered 2023-07-06: 10 mL
  Filled 2023-07-06: qty 20

## 2023-07-06 MED ORDER — ACETAMINOPHEN 500 MG PO TABS: 1000.0000 mg | ORAL_TABLET | Freq: Once | ORAL | Status: DC

## 2023-07-06 MED ORDER — DOXYCYCLINE HYCLATE 100 MG PO CAPS: 100.0000 mg | ORAL_CAPSULE | Freq: Two times a day (BID) | ORAL | 0 refills | Status: DC

## 2023-07-06 MED ORDER — ACETAMINOPHEN 500 MG PO TABS
1000.0000 mg | ORAL_TABLET | Freq: Once | ORAL | Status: AC
  Administered 2023-07-06: 1000 mg via ORAL
  Filled 2023-07-06: qty 2

## 2023-07-06 MED ORDER — OXYCODONE HCL 5 MG PO TABS
5.0000 mg | ORAL_TABLET | Freq: Four times a day (QID) | ORAL | 0 refills | Status: DC | PRN
Start: 2023-07-06 — End: 2023-07-06

## 2023-07-06 NOTE — ED Notes (Signed)
 Per Arlester Ladd, our P.A.; will draw lactate after completion of 2nd IV bolus.

## 2023-07-06 NOTE — ED Triage Notes (Signed)
 Pt caox4 shivering stating he just left after having an abscess drained and packed and that he had a sudden onset of chills, denies any pain.

## 2023-07-06 NOTE — Discharge Instructions (Signed)
 As discussed, your workup was reassuring in the emergency department.  CT scan of your pelvis appeared normal without tracking of infection.  Your blood work did show evidence of lactic acid elevation but I think this is because of your shaking.  Concerned that you may have a reaction from the medication we used to numb up the area before the procedure was performed.  Will recommend continued oral hydration at home.  Continue take medications prescribed from prior ED visit including antibiotics.  Recommend close follow-up with your primary care for reassessment of your symptoms.  Please do not hesitate to return to the emergency department if the worrisome signs and symptoms we discussed become apparent.

## 2023-07-06 NOTE — ED Provider Notes (Addendum)
 [place] EMERGENCY DEPARTMENT AT [facility] Provider Note   CSN: [id number] Arrival date & time: 07/06/23  9562     History  Chief Complaint  Patient presents with   Abscess    Parker Clay is a 53 y.o. male.   Abscess   53 year old male presents emergency department with concern for abscess.  Patient states that he was admitted in the past for abscess that he "let go too far" October of last year and had Fournier gangrene.  States that he began to have pain in the similar area 2 to 3 days ago.  States that it is not nearly as bad as last time.  Denies any drainage, fever, chills, rectal pain, pain with bowel movements.  States that the area of swelling, tenderness is very localized to 1 area without "spreading like last time."  Past medical history significant for hypertension, obesity, Fournier gangrene  Home Medications Prior to Admission medications   Medication Sig Start Date End Date Taking? Authorizing Provider  doxycycline  (VIBRAMYCIN ) 100 MG capsule Take 1 capsule (100 mg total) by mouth 2 (two) times daily. 07/06/23  Yes Neil Balls A, PA  oxyCODONE  (ROXICODONE ) 5 MG immediate release tablet Take 1 tablet (5 mg total) by mouth every 6 (six) hours as needed for severe pain (pain score 7-10). 07/06/23  Yes Neil Balls A, PA  amLODipine  (NORVASC ) 10 MG tablet Take 1 tablet (10 mg total) by mouth daily. 11/29/22 02/27/23  [place], Rema Care, DO  pantoprazole  (PROTONIX ) 40 MG tablet Take 1 tablet (40 mg total) by mouth daily. 03/15/22   Wedderburn, Ngozi N, NP  sildenafil (REVATIO) 20 MG tablet Take 20 mg by mouth daily as needed (erectile dsyfunction). 07/29/20   [provider]  olmesartan-hydrochlorothiazide  (BENICAR HCT) 40-12.5 MG per tablet Take 1 tablet by mouth daily.    05/02/11  [provider]      Allergies    Patient has no known allergies.    Review of Systems   Review of Systems  All other systems reviewed and are  negative.   Physical Exam Updated Vital Signs BP (!) 157/110 (BP Location: Right Arm)   Pulse 84   Temp 98.3 F (36.8 C) (Oral)   Resp 18   Ht 5\' 11"  (1.803 m)   Wt 114.8 kg   SpO2 99%   BMI 35.29 kg/m  Physical Exam Vitals and nursing note reviewed.  Constitutional:      General: He is not in acute distress.    Appearance: He is well-developed.  HENT:     Head: Normocephalic and atraumatic.  Eyes:     Conjunctiva/sclera: Conjunctivae normal.  Cardiovascular:     Rate and Rhythm: Normal rate and regular rhythm.     Heart sounds: No murmur heard. Pulmonary:     Effort: Pulmonary effort is normal. No respiratory distress.     Breath sounds: Normal breath sounds.  Abdominal:     Palpations: Abdomen is soft.     Tenderness: There is no abdominal tenderness.  Genitourinary:      Comments: 3.1 cm in diameter area of induration left of midline perineum as above.  No tenderness with rectal exam.  No appreciable discharge.  No surrounding erythema, obvious palpable fluctuance.  Bedside ultrasound performed did show evidence of fluid collection without obvious sinus tracking.  No gas beneath subcutaneous tissue.  No palpable crepitus. Musculoskeletal:        General: No swelling.     Cervical back: Neck  supple.  Skin:    General: Skin is warm and dry.     Capillary Refill: Capillary refill takes less than 2 seconds.  Neurological:     Mental Status: He is alert.  Psychiatric:        Mood and Affect: Mood normal.     ED Results / Procedures / Treatments   Labs (all labs ordered are listed, but only abnormal results are displayed) Labs Reviewed - No data to display  EKG None  Radiology No results found.  Procedures .Incision and Drainage  Date/Time: 07/06/2023 10:32 AM  Performed by: Sudley Butter, PA Authorized by: [place] Butter, PA   Consent:    Consent obtained:  Verbal   Consent given by:  Patient   Risks discussed:  Bleeding, incomplete drainage,  pain and damage to other organs   Alternatives discussed:  No treatment Universal protocol:    Procedure explained and questions answered to patient or proxy's satisfaction: yes     Relevant documents present and verified: yes     Test results available : yes     Imaging studies available: yes     Required blood products, implants, devices, and special equipment available: yes     Site/side marked: yes     Immediately prior to procedure, a time out was called: yes     Patient identity confirmed:  Verbally with patient Location:    Type:  Abscess   Size:  3.1   Location:  Anogenital   Anogenital location:  Perianal Pre-procedure details:    Skin preparation:  Chlorhexidine  with alcohol Sedation:    Sedation type:  None Anesthesia:    Anesthesia method:  Local infiltration   Local anesthetic:  Lidocaine  2% WITH epi Procedure type:    Complexity:  Complex Procedure details:    Ultrasound guidance: yes     Incision types:  Single straight   Incision depth:  Subcutaneous   Wound management:  Probed and deloculated, irrigated with saline and extensive cleaning   Drainage:  Purulent   Drainage amount:  Copious   Wound treatment:  Drain placed   Packing materials:  1/4 in gauze   Amount 1/4":  5 Post-procedure details:    Procedure completion:  Tolerated well, no immediate complications     Medications Ordered in ED Medications  lidocaine -EPINEPHrine  (XYLOCAINE  W/EPI) 2 %-1:200000 (PF) injection 10 mL (has no administration in time range)    ED Course/ Medical Decision Making/ A&P                                 Medical Decision Making Risk Prescription drug management.   This patient presents to the ED for concern of abscess, this involves an extensive number of treatment options, and is a complaint that carries with it a high risk of complications and morbidity.  The differential diagnosis includes perianal, perirectal abscess, Fournier gangrene, fistulization,  other   Co morbidities that complicate the patient evaluation  See HPI   Additional history obtained:  Additional history obtained from EMR External records from outside source obtained and reviewed including hospital records   Lab Tests:  N/a   Imaging Studies ordered:  N/a   Cardiac Monitoring: / EKG:  N/a   Consultations Obtained:  N/a   Problem List / ED Course / Critical interventions / Medication management  Perianal abscess I ordered medication including lidocaine  with epinephrine    Reevaluation of the  patient after these medicines showed that the patient improved I have reviewed the patients home medicines and have made adjustments as needed   Social Determinants of Health:  Chronic cigar use.  Denies illicit drug use.   Test / Admission - Considered:  Perianal abscess Vitals signs significant for hypertension with blood pressure 157/110. Otherwise within normal range and stable throughout visit. 53 year old male presents emergency department with concern for abscess.  Patient states that he was admitted in the past for abscess that he "let go too far" October of last year and had Fournier gangrene.  States that he began to have pain in the similar area 2 to 3 days ago.  States that it is not nearly as bad as last time.  Denies any drainage, fever, chills, rectal pain, pain with bowel movements.  States that the area of swelling, tenderness is very localized to 1 area without "spreading like last time." On exam, area of induration appreciated left of midline and perineal region.  No obvious rectal tenderness or obvious appreciable drainage.  No physical exam findings concerning for gas but knee subcutaneous tissue/Fournier gangrene.  Bedside ultrasound performed showed evidence of fluid collection as above.  Area incised and drained in manner as above with subsequent packing in place.  Patient symptoms most likely secondary to perianal abscess.  Patient  does not meet SIRS criteria so sepsis protocol was not performed.  Will recommend follow-up with general surgery in the past as he already has established care.  Will recommend local wound care at home as well as taking antibiotics as prescribed.  Strict return precautions discussed at length.  Treatment plan discussed with patient and he acknowledged understanding was agreeable to said plan.  Patient overall well-appearing, afebrile in no acute distress. Worrisome signs and symptoms were discussed with the patient, and the patient acknowledged understanding to return to the ED if noticed. Patient was stable upon discharge.          Final Clinical Impression(s) / ED Diagnoses Final diagnoses:  Abscess    Rx / DC Orders         [place] Butter, [place] 07/06/23 1033    Wynetta Heckle, MD 07/24/23 1220

## 2023-07-06 NOTE — ED Triage Notes (Signed)
 He reports a recurrent abscess at at left buttock contiguous to anus. He states he had an abscess in this same area some months ago at which time he saw a Careers adviser. He denies fever, and is in no distress.

## 2023-07-06 NOTE — ED Provider Notes (Signed)
 [place] EMERGENCY DEPARTMENT AT [facility] Provider Note   CSN: [id number] Arrival date & time: 07/06/23  1104     History  Chief Complaint  Patient presents with   Chills    Parker Clay is a 53 y.o. male.  HPI   53 year old male presents again to the emergency department with complaints of chills, shivering.  Just recently saw patient 30 or so minutes prior to return where he had evidence of a perianal abscess that was incised and drained.  Patient did not have any evidence of meeting of SIRS criteria at that time with localized abscess as indicated in prior documentation.  Patient states that he was on his way home and then came back because he left his glasses in the emergency department.  Felt sudden onset chills, shaking that he could not control.  States he is not having excessive pain or tenderness in the area that was drained as it is still numb.  States he does feel little short of breath because he is shaking and feeling as if his heart is racing.  Past medical history significant for obesity, hypertension, migraine, Fournier gangrene  Home Medications Prior to Admission medications   Medication Sig Start Date End Date Taking? Authorizing Provider  amLODipine  (NORVASC ) 10 MG tablet Take 1 tablet (10 mg total) by mouth daily. 11/29/22 02/27/23  [place], Eric J, DO  doxycycline  (VIBRAMYCIN ) 100 MG capsule Take 1 capsule (100 mg total) by mouth 2 (two) times daily. 07/06/23   [place] Butter, PA  oxyCODONE  (ROXICODONE ) 5 MG immediate release tablet Take 1 tablet (5 mg total) by mouth every 6 (six) hours as needed for severe pain (pain score 7-10). 07/06/23   [place] Butter, PA  pantoprazole  (PROTONIX ) 40 MG tablet Take 1 tablet (40 mg total) by mouth daily. 03/15/22   Wedderburn, Ngozi N, NP  sildenafil (REVATIO) 20 MG tablet Take 20 mg by mouth daily as needed (erectile dsyfunction). 07/29/20   [provider]  olmesartan-hydrochlorothiazide  (BENICAR  HCT) 40-12.5 MG per tablet Take 1 tablet by mouth daily.    05/02/11  [provider]      Allergies    Patient has no known allergies.    Review of Systems   Review of Systems  All other systems reviewed and are negative.   Physical Exam Updated Vital Signs BP 139/66   Pulse 83   Temp 98.9 F (37.2 C) (Oral)   Resp 13   SpO2 99%  Physical Exam Vitals and nursing note reviewed. Exam conducted with a chaperone present.  Constitutional:      Appearance: He is well-developed. He is ill-appearing.     Comments: Patient ill-appearing and shaking in bed unable to control himself.  Heart rate in the 120s/130s.  HENT:     Head: Normocephalic and atraumatic.  Eyes:     Conjunctiva/sclera: Conjunctivae normal.  Cardiovascular:     Rate and Rhythm: Regular rhythm. Tachycardia present.     Heart sounds: No murmur heard. Pulmonary:     Effort: No respiratory distress.     Breath sounds: Normal breath sounds.     Comments: Patient tachypneic.  Lungs clear to auscultation bilaterally. Abdominal:     Palpations: Abdomen is soft.     Tenderness: There is no abdominal tenderness.  Genitourinary:    Comments: To exam relatively unremarkable.  Prior incision measuring 2 to 2.5 cm in length present with packing material present.  Scant amount of bloody discharge present. Musculoskeletal:  General: No swelling.     Cervical back: Neck supple.  Skin:    General: Skin is warm and dry.     Capillary Refill: Capillary refill takes less than 2 seconds.  Neurological:     Mental Status: He is alert.  Psychiatric:        Mood and Affect: Mood normal.     ED Results / Procedures / Treatments   Labs (all labs ordered are listed, but only abnormal results are displayed) Labs Reviewed  LACTIC ACID, PLASMA - Abnormal; Notable for the following components:      Result Value   Lactic Acid, Venous 2.4 (*)    All other components within normal limits  CULTURE, BLOOD (ROUTINE X 2)   CULTURE, BLOOD (ROUTINE X 2)  CBC WITH DIFFERENTIAL/PLATELET  LACTIC ACID, PLASMA    EKG None  Radiology CT ABDOMEN PELVIS W CONTRAST Result Date: 07/06/2023 CLINICAL DATA:  Chills.  History of perianal abscess. EXAM: CT ABDOMEN AND PELVIS WITH CONTRAST TECHNIQUE: Multidetector CT imaging of the abdomen and pelvis was performed using the standard protocol following bolus administration of intravenous contrast. RADIATION DOSE REDUCTION: This exam was performed according to the departmental dose-optimization program which includes automated exposure control, adjustment of the mA and/or kV according to patient size and/or use of iterative reconstruction technique. CONTRAST:  OMNIPAQUE  IOHEXOL  300 MG/ML  SOLN COMPARISON:  November 26, 2022. FINDINGS: Lower chest: No acute abnormality. Hepatobiliary: No focal liver abnormality is seen. No gallstones, gallbladder wall thickening, or biliary dilatation. Pancreas: Unremarkable. No pancreatic ductal dilatation or surrounding inflammatory changes. Spleen: Normal in size without focal abnormality. Adrenals/Urinary Tract: Adrenal glands appear normal. Left renal cyst is noted. No hydronephrosis or renal obstruction. Urinary bladder is unremarkable. Stomach/Bowel: Stomach is within normal limits. Appendix appears normal. No evidence of bowel wall thickening, distention, or inflammatory changes. Vascular/Lymphatic: No significant vascular findings are present. No enlarged abdominal or pelvic lymph nodes. Reproductive: Prostate is unremarkable. Other: No ascites or hernia is noted. Musculoskeletal: No acute or significant osseous findings. IMPRESSION: No acute abnormality seen in the abdomen or pelvis. Electronically Signed   By: Rosalene Colon M.D.   On: 07/06/2023 13:00    Procedures Procedures    Medications Ordered in ED Medications  acetaminophen  (TYLENOL ) tablet 1,000 mg (1,000 mg Oral Given 07/06/23 1211)  sodium chloride  0.9 % bolus 1,000 mL (0  mLs Intravenous Stopped 07/06/23 1341)  iohexol  (OMNIPAQUE ) 300 MG/ML solution 100 mL (100 mLs Intravenous Contrast Given 07/06/23 1240)  sodium chloride  0.9 % bolus 1,000 mL (0 mLs Intravenous Stopped 07/06/23 1523)  clindamycin  (CLEOCIN ) IVPB 300 mg (0 mg Intravenous Stopped 07/06/23 1557)    ED Course/ Medical Decision Making/ A&P                                 Medical Decision Making Amount and/or Complexity of Data Reviewed Labs: ordered. Radiology: ordered.  Risk OTC drugs. Prescription drug management.   This patient presents to the ED for concern of chills, tachycardia, this involves an extensive number of treatment options, and is a complaint that carries with it a high risk of complications and morbidity.  The differential diagnosis includes sepsis, lidocaine  with epinephrine  toxicity/intravenous injection,   Co morbidities that complicate the patient evaluation  See HPI   Additional history obtained:  Additional history obtained from EMR External records from outside source obtained and reviewed including hospital records  Lab Tests:  I Ordered, and personally interpreted labs.  The pertinent results include: No leukocytosis.  No evidence of anemia.  Platelets within range.  No electrolyte abnormalities.  No renal dysfunction.  Lactic acid elevated initially of 3.1 with repeat2.4.  Blood cultures pending.   Imaging Studies ordered:  I ordered imaging studies including CT abdomen pelvis I independently visualized and interpreted imaging which showed no acute abnormality I agree with the radiologist interpretation   Cardiac Monitoring: / EKG:  The patient was maintained on a cardiac monitor.  I personally viewed and interpreted the cardiac monitored which showed an underlying rhythm of: Sinus tachycardia   Consultations Obtained:  I requested consultation with attending Dr. Daivd Dub who was in agreement treatment plan going forward after independent  evaluation   Problem List / ED Course / Critical interventions / Medication management  Shaking with chills I ordered medication including Tylenol    Reevaluation of the patient after these medicines showed that the patient improved I have reviewed the patients home medicines and have made adjustments as needed   Social Determinants of Health:  Cigar use.  Denies illicit drug use.   Test / Admission - Considered:  Shaking with chills Vitals signs significant for tachycardia initially heart rate in the 130s as well as hypertension with blood pressure 146/113 of which improved to heart rate of 83, blood pressure 139/66 upon discharge.. Otherwise within normal range and stable throughout visit. Laboratory/imaging studies significant for: See above 53 year old male presents again to the emergency department with complaints of chills, shivering.  Just recently saw patient 30 or so minutes prior to return where he had evidence of a perianal abscess that was incised and drained.  Patient did not have any evidence of meeting of SIRS criteria at that time with localized abscess as indicated in prior documentation.  Patient states that he was on his way home and then came back because he left his glasses in the emergency department.  Felt sudden onset chills, shaking that he could not control.  States he is not having excessive pain or tenderness in the area that was drained as it is still numb.  States he does feel little short of breath because he is shaking and feeling as if his heart is racing. On exam initially, patient with heart rate in the 120s, feeling chills and shaking.  Sepsis protocol was subsequently performed.  Workup was very reassuring.  No leukocytosis.  Initial lactic acid elevated most likely due to shaking present for 30 minutes to an hour with improvement with IV fluids.  CT imaging was obtained of patient's abdomen/pelvis given concern for possible de states infection which was  reassuring as well.  Concerned that patient may have had adverse effect to local Anastassia's or is injected to local vein during I&D procedure given somewhat quick and drastic change in clinical presentation upon prior discharge.  Patient observed for 5 and half hours while the emergency department with IV fluid hydration and noted entire resolution of symptoms.  Blood cultures were obtained and are pending.  Will recommend continued local wound care at home and taking of antibiotic as prescribed from prior discharge.  Treatment plan discussed with patient and he acknowledged understanding was agreeable to said plan.  Patient overall well-appearing, afebrile in no acute distress upon discharge. Treatment plan were discussed at length with patient and they knowledge understanding was agreeable to said plan.  Appropriate consultations were made as described in the ED course.  Patient was stable  upon admission to the hospital.         Final Clinical Impression(s) / ED Diagnoses Final diagnoses:  Shaking chills    Rx / DC Orders ED Discharge Orders     None         [place] Butter, [place] 07/06/23 1803    Wynetta Heckle, MD 07/24/23 1221

## 2023-07-06 NOTE — Discharge Instructions (Addendum)
 As discussed, your abscess was drained while in emergency department.  Packing material was placed.  Please leave this in for 48 to 72 hours before removing it.  Recommend wetting the area when you remove it to make it the least amount uncomfortable.  This is typically done best when you are in the shower.  It will falls out on its own before then, please do not try to place packing material back in there.  While patient on antibiotics given concern for infection.  Recommend Tylenol  for baseline pain.  Recommend follow-up with general surgery given that this is the second time that you had an abscess in this area.   Please do not hesitate to return to emergency department if the worrisome signs and symptoms we discussed become apparent.

## 2023-07-06 NOTE — ED Notes (Signed)
 Note: IV bolus interrupted for CT. Will time the obtaining of Lactate to match the completion of the IV NS bolus.

## 2023-07-10 ENCOUNTER — Telehealth (HOSPITAL_BASED_OUTPATIENT_CLINIC_OR_DEPARTMENT_OTHER): Payer: Self-pay | Admitting: Emergency Medicine

## 2023-07-10 LAB — BLOOD CULTURE ID PANEL (REFLEXED) - BCID2

## 2023-07-10 NOTE — ED Notes (Addendum)
 Writer called patient after being notified of (+) BC by micro and then instructed by [place], MD to call patient and request he come back to hospital for likely admission and IV ATB. Call went directly to VM. Writer left VM asking patient to call back at earliest convenience. Called @ 0500 on 07/10/2023

## 2023-07-11 LAB — CULTURE, BLOOD (ROUTINE X 2)
Culture: NO GROWTH
Special Requests: ADEQUATE

## 2023-07-15 LAB — CULTURE, BLOOD (ROUTINE X 2): Special Requests: ADEQUATE

## 2023-07-16 ENCOUNTER — Telehealth (HOSPITAL_BASED_OUTPATIENT_CLINIC_OR_DEPARTMENT_OTHER): Payer: Self-pay | Admitting: *Deleted

## 2023-07-16 NOTE — Telephone Encounter (Signed)
 Post ED Visit - Positive Culture Follow-up  Culture report reviewed by antimicrobial stewardship pharmacist: [facility] [x]  Adaline Ada, [place].D. []  Skeet Duke, Pharm.D., BCPS AQ-ID []  Leslee Rase, Pharm.D., BCPS []  Garland Junk, [address].D., BCPS []  [place], [address].D., BCPS, AAHIVP []  Alcide Aly, Pharm.D., BCPS, AAHIVP []  Jerri Morale, PharmD, BCPS []  Graham Laws, PharmD, BCPS []  Cleda Curly, PharmD, BCPS []  Tamar Fairly, PharmD []  Ballard Levels, PharmD, BCPS []  Ollen Beverage, PharmD  [facility] []  Arlyne Bering, PharmD []  Sherryle Don, PharmD []  Van Gelinas, PharmD []  Delila Felty, Rph []  [place]) Cleora Daft, PharmD []  Augustina Block, PharmD []  Arie Kurtz, PharmD []  Sharlyn Deaner, PharmD []  Agnes Hose, PharmD []  Kendall Pauls, PharmD []  Gladstone Lamer, PharmD []  Armanda Bern, PharmD []  Tera Fellows, PharmD   Positive blood culture No further patient follow-up is required at this time.  Jessee Mormon 07/16/2023, 9:39 AM

## 2023-07-31 ENCOUNTER — Other Ambulatory Visit: Payer: Self-pay | Admitting: *Deleted

## 2023-07-31 DIAGNOSIS — I8393 Asymptomatic varicose veins of bilateral lower extremities: Secondary | ICD-10-CM

## 2023-08-08 ENCOUNTER — Ambulatory Visit (HOSPITAL_COMMUNITY): Attending: Family Medicine

## 2023-09-16 ENCOUNTER — Ambulatory Visit: Payer: Self-pay | Attending: Family Medicine

## 2023-09-17 ENCOUNTER — Ambulatory Visit (HOSPITAL_COMMUNITY): Admission: RE | Admit: 2023-09-17 | Payer: Self-pay | Source: Ambulatory Visit

## 2023-09-17 ENCOUNTER — Other Ambulatory Visit: Payer: Self-pay | Admitting: *Deleted

## 2023-09-17 DIAGNOSIS — I8393 Asymptomatic varicose veins of bilateral lower extremities: Secondary | ICD-10-CM

## 2023-09-19 ENCOUNTER — Ambulatory Visit (HOSPITAL_COMMUNITY)
Admission: RE | Admit: 2023-09-19 | Discharge: 2023-09-19 | Disposition: A | Discharge status: Home / Self Care | Payer: Self-pay | Source: Ambulatory Visit | Attending: Physician Assistant | Admitting: Physician Assistant

## 2023-09-19 DIAGNOSIS — I8393 Asymptomatic varicose veins of bilateral lower extremities: Secondary | ICD-10-CM | POA: Insufficient documentation

## 2023-09-23 ENCOUNTER — Ambulatory Visit: Payer: Self-pay | Attending: Vascular Surgery | Admitting: Physician Assistant

## 2023-09-23 VITALS — BP 144/81 | HR 75 | Temp 97.7°F | Wt 357.3 lb

## 2023-09-23 DIAGNOSIS — I872 Venous insufficiency (chronic) (peripheral): Secondary | ICD-10-CM | POA: Insufficient documentation

## 2023-10-01 NOTE — Progress Notes (Signed)
 Requested by:  Ilah Crigler, MD [address] [place],  [place] [address]  Reason for consultation: leg heaviness    History of Present Illness   Parker Clay is a 53 y.o. (12-Jan-1971) male who presents for evaluation of leg heaviness.  The patient states sometime last year he started having issues with leg heaviness in the mornings.  This would wear off later on in the day.  He had no issues with leg swelling, itching, or discoloration.  He is unsure as to what caused his leg heaviness, however he says that his symptoms significantly improved at the end of last year after switching from Viagra to Cialis.  He endorses frequent use of his erectile dysfunction medication, almost daily.  At today's visit, he has been free of leg heaviness for a couple of months now.   He has no prior history of DVT.  He does not wear compression stockings.  He does not elevate his legs.  He denies any previous or current symptoms of claudication, rest pain, or tissue loss.  He does smoke cigars daily.  Past Medical History:  Diagnosis Date   Hypertension    Obesity     Past Surgical History:  Procedure Laterality Date   INCISION AND DRAINAGE PERIRECTAL ABSCESS N/A 11/26/2022   Procedure: IRRIGATION AND DEBRIDEMENT AND EXAMINE UNDER ANESTHESIA;  Surgeon: Polly Cordella LABOR, MD;  Location: [facility] OR;  Service: General;  Laterality: N/A;    Social History   Socioeconomic History   Marital status: Married    Spouse name: Not on file   Number of children: Not on file   Years of education: Not on file   Highest education level: Not on file  Occupational History   Not on file  Tobacco Use   Smoking status: Every Day    Types: Cigars   Smokeless tobacco: Never  Vaping Use   Vaping status: Never Used  Substance and Sexual Activity   Alcohol use: Yes    Comment: occasionally   Drug use: No   Sexual activity: Yes  Other Topics Concern   Not on file  Social History Narrative   Not on file    Social Drivers of Health   Financial Resource Strain: Not on file  Food Insecurity: No Food Insecurity (11/26/2022)   Hunger Vital Sign    Worried About Running Out of Food in the Last Year: Never true    Ran Out of Food in the Last Year: Never true  Transportation Needs: No Transportation Needs (11/26/2022)   PRAPARE - Administrator, Civil Service (Medical): No    Lack of Transportation (Non-Medical): No  Physical Activity: Not on file  Stress: Not on file  Social Connections: Not on file  Intimate Partner Violence: Not At Risk (11/26/2022)   Humiliation, Afraid, Rape, and Kick questionnaire    Fear of Current or Ex-Partner: No    Emotionally Abused: No    Physically Abused: No    Sexually Abused: No    Family History  Problem Relation Age of Onset   Cancer Mother 34       breast   Cancer Father 32       prostate    Current Outpatient Medications  Medication Sig Dispense Refill   amLODipine  (NORVASC ) 10 MG tablet Take 1 tablet (10 mg total) by mouth daily. 90 tablet 0   doxycycline  (VIBRAMYCIN ) 100 MG capsule Take 1 capsule (100 mg total) by mouth 2 (two) times  daily. 20 capsule 0   oxyCODONE  (ROXICODONE ) 5 MG immediate release tablet Take 1 tablet (5 mg total) by mouth every 6 (six) hours as needed for severe pain (pain score 7-10). 8 tablet 0   pantoprazole  (PROTONIX ) 40 MG tablet Take 1 tablet (40 mg total) by mouth daily. 45 tablet 0   sildenafil (REVATIO) 20 MG tablet Take 20 mg by mouth daily as needed (erectile dsyfunction).     No current facility-administered medications for this visit.    No Known Allergies  REVIEW OF SYSTEMS (negative unless checked):   Cardiac:  []  Chest pain or chest pressure? []  Shortness of breath upon activity? []  Shortness of breath when lying flat? []  Irregular heart rhythm?  Vascular:  []  Pain in calf, thigh, or hip brought on by walking? []  Pain in feet at night that wakes you up from your sleep? []  Blood clot in  your veins? []  Leg swelling?  Pulmonary:  []  Oxygen at home? []  Productive cough? []  Wheezing?  Neurologic:  []  Sudden weakness in arms or legs? []  Sudden numbness in arms or legs? []  Sudden onset of difficult speaking or slurred speech? []  Temporary loss of vision in one eye? []  Problems with dizziness?  Gastrointestinal:  []  Blood in stool? []  Vomited blood?  Genitourinary:  []  Burning when urinating? []  Blood in urine?  Psychiatric:  []  Major depression  Hematologic:  []  Bleeding problems? []  Problems with blood clotting?  Dermatologic:  []  Rashes or ulcers?  Constitutional:  []  Fever or chills?  Ear/Nose/Throat:  []  Change in hearing? []  Nose bleeds? []  Sore throat?  Musculoskeletal:  []  Back pain? []  Joint pain? []  Muscle pain?   Physical Examination     Vitals:   09/23/23 1226  BP: (!) 144/81  Pulse: 75  Temp: 97.7 F (36.5 C)  TempSrc: Temporal  Weight: (!) 357 lb 4.8 oz (162.1 kg)   Body mass index is 49.83 kg/m.  General:  WDWN in NAD; vital signs documented above Gait: Not observed HENT: WNL, normocephalic Pulmonary: normal non-labored breathing , without Rales, rhonchi,  wheezing Cardiac: Regular Abdomen: soft, NT, no masses Skin: without rashes Vascular Exam/Pulses: 2+ DP pulses bilaterally Extremities: without varicose veins, without reticular veins, without edema, without stasis pigmentation, without lipodermatosclerosis, without ulcers Musculoskeletal: no muscle wasting or atrophy  Neurologic: A&O X 3;  No focal weakness or paresthesias are detected Psychiatric:  The pt has Normal affect.  Non-invasive Vascular Imaging   RLE Venous Insufficiency Duplex (09/19/2023):  +--------------+---------+------+-----------+------------+--------+  RIGHT        Reflux NoRefluxReflux TimeDiameter cmsComments                          Yes                                    +--------------+---------+------+-----------+------------+--------+  CFV                    yes   >1 second                       +--------------+---------+------+-----------+------------+--------+  FV mid        no                                              +--------------+---------+------+-----------+------------+--------+  Popliteal    no                                              +--------------+---------+------+-----------+------------+--------+  GSV at [facility]    no                            0.79              +--------------+---------+------+-----------+------------+--------+  GSV prox thigh          yes    >500 ms      0.37              +--------------+---------+------+-----------+------------+--------+  GSV mid thigh no                            0.46              +--------------+---------+------+-----------+------------+--------+  GSV dist thigh          yes    >500 ms      0.34              +--------------+---------+------+-----------+------------+--------+  GSV at knee   no                            0.35              +--------------+---------+------+-----------+------------+--------+  GSV prox calf           yes    >500 ms      0.35              +--------------+---------+------+-----------+------------+--------+  SSV at [facility]    no                            0.16              +--------------+---------+------+-----------+------------+--------+  SSV prox calf no                            0.14              +--------------+---------+------+-----------+------------+--------+  SSV mid calf  no                            0.34              +--------------+---------+------+-----------+------------+--------+     Medical Decision Making   Markees R Cranor is a 53 y.o. male who presents for evaluation of leg heaviness  Based on the patient's duplex, there is reflux in the right common femoral vein and  greater saphenous vein at the proximal thigh, distal thigh, and proximal calf.  The remainder of the patient's deep and superficial venous system is competent.  There is no evidence of DVT or SVT on exam.  He would not be a candidate for saphenous vein ablation given that his greater saphenous vein is competent at the [id number]. The patient says that last year he experienced issues with bilateral leg heaviness in the mornings.  He says that his legs would feel heavy in the mornings and then get better later on the day.  He had no accompanying  skin changes or lower extremity swelling. He did not attempt any leg elevation or compression stockings to help with his leg heaviness.  As of today's visit he has not had his leg heaviness for a couple of months now.  He does endorse use of erectile dysfunction medications for a while now.  He says after switching from Viagra to Cialis his leg heaviness went away On exam his lower extremities are well-perfused with 2+ DP pulses.  He has no lower extremity swelling.  He has no bulging varicose veins.  He has no lower extremity discoloration. I have explained to the patient that he does have evidence of venous insufficiency in the right lower extremity, which could cause issues with edema in the future.  However, I do not think that his venous insufficiency was contributing to his leg heaviness last year.  Typically venous insufficiency causes leg heaviness to present later on in the day and is accompanied by leg swelling.  Potentially his ED medications have contributed to his leg heaviness. From a vein standpoint, I have encouraged him to exercise daily and elevate his legs and wear compression stockings as needed.  He can follow-up with our office as needed   Ahmed SHAUNNA Holster, PA-C [facility] Office: [phone]  09/23/2023, 3:00PM  Clinic MD: [facility]

## 2024-02-05 ENCOUNTER — Encounter (HOSPITAL_BASED_OUTPATIENT_CLINIC_OR_DEPARTMENT_OTHER): Payer: Self-pay | Admitting: Internal Medicine

## 2024-02-05 DIAGNOSIS — R0683 Snoring: Secondary | ICD-10-CM

## 2024-02-05 DIAGNOSIS — R5383 Other fatigue: Secondary | ICD-10-CM

## 2024-02-05 DIAGNOSIS — R0681 Apnea, not elsewhere classified: Secondary | ICD-10-CM
